# Patient Record
Sex: Female | Born: 1955
Health system: Southern US, Community
[De-identification: ages and names within clinical notes are randomized; demographics above are authoritative.]

## PROBLEM LIST (undated history)

## (undated) DIAGNOSIS — Z9889 Other specified postprocedural states: Secondary | ICD-10-CM

## (undated) DIAGNOSIS — R112 Nausea with vomiting, unspecified: Secondary | ICD-10-CM

## (undated) HISTORY — PX: VAGINAL HYSTERECTOMY: SUR661

---

## 1998-06-27 ENCOUNTER — Other Ambulatory Visit: Admission: RE | Admit: 1998-06-27 | Discharge: 1998-06-27 | Payer: Self-pay | Admitting: Obstetrics and Gynecology

## 1999-05-13 ENCOUNTER — Emergency Department (HOSPITAL_COMMUNITY): Admission: EM | Admit: 1999-05-13 | Discharge: 1999-05-13 | Payer: Self-pay | Admitting: Emergency Medicine

## 1999-09-27 ENCOUNTER — Other Ambulatory Visit: Admission: RE | Admit: 1999-09-27 | Discharge: 1999-09-27 | Payer: Self-pay | Admitting: Obstetrics and Gynecology

## 2000-03-30 ENCOUNTER — Emergency Department (HOSPITAL_COMMUNITY): Admission: EM | Admit: 2000-03-30 | Discharge: 2000-03-30 | Payer: Self-pay | Admitting: Emergency Medicine

## 2000-03-30 ENCOUNTER — Encounter: Payer: Self-pay | Admitting: Emergency Medicine

## 2001-04-28 ENCOUNTER — Other Ambulatory Visit: Admission: RE | Admit: 2001-04-28 | Discharge: 2001-04-28 | Payer: Self-pay | Admitting: Obstetrics and Gynecology

## 2002-05-03 ENCOUNTER — Other Ambulatory Visit: Admission: RE | Admit: 2002-05-03 | Discharge: 2002-05-03 | Payer: Self-pay | Admitting: Obstetrics and Gynecology

## 2003-03-18 ENCOUNTER — Ambulatory Visit (HOSPITAL_COMMUNITY): Admission: RE | Admit: 2003-03-18 | Discharge: 2003-03-18 | Payer: Self-pay | Admitting: Family Medicine

## 2003-03-18 ENCOUNTER — Encounter: Payer: Self-pay | Admitting: Family Medicine

## 2003-08-09 ENCOUNTER — Other Ambulatory Visit: Admission: RE | Admit: 2003-08-09 | Discharge: 2003-08-09 | Payer: Self-pay | Admitting: Obstetrics and Gynecology

## 2006-04-15 ENCOUNTER — Other Ambulatory Visit: Admission: RE | Admit: 2006-04-15 | Discharge: 2006-04-15 | Payer: Self-pay | Admitting: Obstetrics and Gynecology

## 2006-10-28 HISTORY — PX: CARPAL TUNNEL RELEASE: SHX101

## 2007-04-17 ENCOUNTER — Encounter: Admission: RE | Admit: 2007-04-17 | Discharge: 2007-04-17 | Payer: Self-pay | Admitting: Orthopedic Surgery

## 2008-04-13 ENCOUNTER — Other Ambulatory Visit: Admission: RE | Admit: 2008-04-13 | Discharge: 2008-04-13 | Payer: Self-pay | Admitting: Obstetrics and Gynecology

## 2008-04-27 LAB — CONVERTED CEMR LAB: Pap Smear: NORMAL

## 2008-06-15 ENCOUNTER — Encounter: Payer: Self-pay | Admitting: Internal Medicine

## 2008-07-14 ENCOUNTER — Ambulatory Visit: Payer: Self-pay | Admitting: Internal Medicine

## 2008-07-14 DIAGNOSIS — M899 Disorder of bone, unspecified: Secondary | ICD-10-CM | POA: Insufficient documentation

## 2008-07-14 DIAGNOSIS — M949 Disorder of cartilage, unspecified: Secondary | ICD-10-CM

## 2008-07-14 LAB — CONVERTED CEMR LAB
Bilirubin Urine: NEGATIVE
Blood in Urine, dipstick: NEGATIVE
Glucose, Urine, Semiquant: NEGATIVE
Ketones, urine, test strip: NEGATIVE
Nitrite: NEGATIVE
Protein, U semiquant: NEGATIVE
Specific Gravity, Urine: 1.02
Urobilinogen, UA: 0.2
pH: 6

## 2008-07-15 ENCOUNTER — Encounter: Payer: Self-pay | Admitting: Internal Medicine

## 2008-07-15 LAB — CONVERTED CEMR LAB
AST: 29 units/L (ref 0–37)
Alkaline Phosphatase: 66 units/L (ref 39–117)
Basophils Relative: 0.1 % (ref 0.0–3.0)
Creatinine, Ser: 0.7 mg/dL (ref 0.4–1.2)
Direct LDL: 116.5 mg/dL
Eosinophils Absolute: 0.1 10*3/uL (ref 0.0–0.7)
Eosinophils Relative: 0.8 % (ref 0.0–5.0)
GFR calc non Af Amer: 93 mL/min
Glucose, Bld: 96 mg/dL (ref 70–99)
Lymphocytes Relative: 19.9 % (ref 12.0–46.0)
MCV: 89.3 fL (ref 78.0–100.0)
Neutrophils Relative %: 73 % (ref 43.0–77.0)
Platelets: 226 10*3/uL (ref 150–400)
Potassium: 3.8 meq/L (ref 3.5–5.1)
RBC: 4.53 M/uL (ref 3.87–5.11)
RDW: 12.4 % (ref 11.5–14.6)
TSH: 0.56 microintl units/mL (ref 0.35–5.50)
Total Bilirubin: 0.7 mg/dL (ref 0.3–1.2)
Triglycerides: 61 mg/dL (ref 0–149)

## 2009-03-08 ENCOUNTER — Encounter: Payer: Self-pay | Admitting: Internal Medicine

## 2009-04-07 ENCOUNTER — Ambulatory Visit: Payer: Self-pay | Admitting: Internal Medicine

## 2009-04-07 DIAGNOSIS — J029 Acute pharyngitis, unspecified: Secondary | ICD-10-CM | POA: Insufficient documentation

## 2009-04-10 ENCOUNTER — Telehealth: Payer: Self-pay | Admitting: Internal Medicine

## 2009-05-11 ENCOUNTER — Other Ambulatory Visit: Admission: RE | Admit: 2009-05-11 | Discharge: 2009-05-11 | Payer: Self-pay | Admitting: Obstetrics and Gynecology

## 2009-05-11 ENCOUNTER — Encounter: Payer: Self-pay | Admitting: Obstetrics and Gynecology

## 2009-05-11 ENCOUNTER — Ambulatory Visit: Payer: Self-pay | Admitting: Obstetrics and Gynecology

## 2009-09-06 ENCOUNTER — Ambulatory Visit: Payer: Self-pay | Admitting: Internal Medicine

## 2009-09-24 ENCOUNTER — Emergency Department (HOSPITAL_COMMUNITY): Admission: EM | Admit: 2009-09-24 | Discharge: 2009-09-24 | Payer: Self-pay | Admitting: Emergency Medicine

## 2009-09-27 ENCOUNTER — Telehealth: Payer: Self-pay | Admitting: Internal Medicine

## 2010-08-17 ENCOUNTER — Ambulatory Visit: Payer: Self-pay | Admitting: Internal Medicine

## 2010-10-28 LAB — HM COLONOSCOPY

## 2010-11-27 NOTE — Assessment & Plan Note (Signed)
Summary: FLU-SHOT/RCD  Nurse Visit   Allergies: No Known Drug Allergies  Orders Added: 1)  Admin 1st Vaccine [90471] 2)  Flu Vaccine 17yrs + [16109] Flu Vaccine Consent Questions     Do you have a history of severe allergic reactions to this vaccine? no    Any prior history of allergic reactions to egg and/or gelatin? no    Do you have a sensitivity to the preservative Thimersol? no    Do you have a past history of Guillan-Barre Syndrome? no    Do you currently have an acute febrile illness? no    Have you ever had a severe reaction to latex? no    Vaccine information given and explained to patient? yes    Are you currently pregnant? no    Lot Number:[id number]   Exp Date:04/27/2011   Site Given  Left Deltoid IM  Appended Document: FLU-SHOT/RCD    Clinical Lists Changes  Orders: Added new Service order of Tdap => 10yrs IM (60454) - Signed Added new Service order of Admin 1st Vaccine (09811) - Signed Observations: Added new observation of TD BOOSTERLO: [id number] (08/17/2010 10:58) Added new observation of TD BOOST EXP: 08/16/2012 (08/17/2010 10:58) Added new observation of TD BOOSTERBY: Debby Meadows CMA AAMA (08/17/2010 10:58) Added new observation of TD BOOSTERRT: IM (08/17/2010 10:58) Added new observation of TDBOOSTERDSE: 0.5 ml (08/17/2010 10:58) Added new observation of TD BOOSTERMF: GlaxoSmithKline (08/17/2010 10:58) Added new observation of TD BOOST SIT: right deltoid (08/17/2010 10:58) Added new observation of TD BOOSTER: Tdap (08/17/2010 10:58)       Immunizations Administered:  Tetanus Vaccine:    Vaccine Type: Tdap    Site: right deltoid    Mfr: GlaxoSmithKline    Dose: 0.5 ml    Route: IM    Given by: Lynann Beaver CMA AAMA    Exp. Date: 08/16/2012    Lot #: [id number]

## 2011-09-12 ENCOUNTER — Ambulatory Visit: Payer: Medicare HMO | Admitting: *Deleted

## 2011-09-18 ENCOUNTER — Ambulatory Visit
Admission: RE | Admit: 2011-09-18 | Discharge: 2011-09-18 | Disposition: A | Discharge status: Home / Self Care | Payer: Private Health Insurance - Indemnity | Source: Ambulatory Visit | Attending: Specialist | Admitting: Specialist

## 2011-09-18 ENCOUNTER — Other Ambulatory Visit: Payer: Self-pay | Admitting: Specialist

## 2011-09-18 DIAGNOSIS — M25569 Pain in unspecified knee: Secondary | ICD-10-CM

## 2011-09-20 ENCOUNTER — Other Ambulatory Visit: Payer: Self-pay | Admitting: Orthopaedic Surgery

## 2011-09-20 NOTE — H&P (Signed)
Rebecca Davis is an 55 y.o. female.   Chief Complaint:  Left knee tibial plateau fracture HPI:  DOI 09/18/11  Her 80 lbs Lab puppy ran into her from the side with valgus injury to her left knee with acute pain and inability to walk.  Xray and CT showed left tibial plateau fracture with more than 8mm depression  No past medical history on file. carpal tunnel release 2009  No past surgical history on file.  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   No current facility-administered medications on file as of 09/20/2011.   Calcium, vit D. ASA 1 po bid     norco currently for left knee fracture pain  No results found for this or any previous visit (from the past 48 hour(s)). No results found. labs to be drawn Monday  Review of Systems  Constitutional: Negative.   HENT: Negative.   Eyes: Negative.   Respiratory: Negative.   Cardiovascular: Negative.   Gastrointestinal: Negative.   Genitourinary: Negative.   Musculoskeletal: Positive for joint pain.       Previous Carpal tunnel release 2009  Skin: Negative.   Neurological: Negative.   Endo/Heme/Allergies: Negative.   Psychiatric/Behavioral: Negative.     There were no vitals taken for this visit. Physical Exam  Constitutional: She is oriented to person, place, and time. She appears well-developed and well-nourished.  HENT:  Head: Normocephalic and atraumatic.  Eyes: Pupils are equal, round, and reactive to light.  Neck: Normal range of motion.  Cardiovascular: Normal rate and regular rhythm.   No murmur heard. Respiratory: Effort normal and breath sounds normal. No respiratory distress. She has no wheezes.  GI: Soft.  Musculoskeletal:       Left knee hemarthrosis,  Pulses 2 plus  DP and  PT no pain with hip ROM  Neurological: She is alert and oriented to person, place, and time.  Skin: Skin is warm and dry. No rash noted.  Psychiatric: She has a normal mood and affect.     Assessment/Plan Left depressed lateral tibial plateau fracture. Discussed plan and procedure , risks vs benefit of surgery, restoration of joint surface,  Risks of infection, bleeding , reoperation,  Stiffness, hardware problems,  Knee arthritis,  DVT etc among risks discussed.  She understands and requests we proceed.  All ?'s answered.   [name] C 09/20/2011, 7:31 PM

## 2011-09-22 MED ORDER — DEXTROSE 5 % IV SOLN: 1.0000 g | INTRAVENOUS | Status: DC

## 2011-09-22 MED ORDER — CEFAZOLIN SODIUM 1 G IJ SOLR: 1.0000 g | INTRAMUSCULAR | Status: DC

## 2011-09-23 ENCOUNTER — Encounter (HOSPITAL_COMMUNITY)
Admission: RE | Disposition: A | Discharge status: Home Health | Payer: Self-pay | Source: Ambulatory Visit | Attending: Orthopaedic Surgery

## 2011-09-23 ENCOUNTER — Encounter (HOSPITAL_COMMUNITY): Payer: Self-pay | Admitting: *Deleted

## 2011-09-23 ENCOUNTER — Inpatient Hospital Stay (HOSPITAL_COMMUNITY): Payer: Private Health Insurance - Indemnity | Admitting: Anesthesiology

## 2011-09-23 ENCOUNTER — Inpatient Hospital Stay (HOSPITAL_COMMUNITY)
Admission: RE | Admit: 2011-09-23 | Discharge: 2011-09-26 | DRG: 494 | Disposition: A | Discharge status: Home Health | Payer: Private Health Insurance - Indemnity | Source: Ambulatory Visit | Attending: Orthopaedic Surgery | Admitting: Orthopaedic Surgery

## 2011-09-23 ENCOUNTER — Inpatient Hospital Stay (HOSPITAL_COMMUNITY): Payer: Private Health Insurance - Indemnity

## 2011-09-23 ENCOUNTER — Encounter (HOSPITAL_COMMUNITY): Payer: Self-pay | Admitting: Anesthesiology

## 2011-09-23 DIAGNOSIS — IMO0002 Reserved for concepts with insufficient information to code with codable children: Secondary | ICD-10-CM | POA: Diagnosis present

## 2011-09-23 DIAGNOSIS — S82143A Displaced bicondylar fracture of unspecified tibia, initial encounter for closed fracture: Secondary | ICD-10-CM

## 2011-09-23 DIAGNOSIS — J029 Acute pharyngitis, unspecified: Secondary | ICD-10-CM | POA: Diagnosis present

## 2011-09-23 DIAGNOSIS — Z79899 Other long term (current) drug therapy: Secondary | ICD-10-CM

## 2011-09-23 DIAGNOSIS — S82109A Unspecified fracture of upper end of unspecified tibia, initial encounter for closed fracture: Principal | ICD-10-CM | POA: Diagnosis present

## 2011-09-23 DIAGNOSIS — Z7982 Long term (current) use of aspirin: Secondary | ICD-10-CM

## 2011-09-23 DIAGNOSIS — M899 Disorder of bone, unspecified: Secondary | ICD-10-CM | POA: Diagnosis present

## 2011-09-23 DIAGNOSIS — M949 Disorder of cartilage, unspecified: Secondary | ICD-10-CM

## 2011-09-23 HISTORY — DX: Other specified postprocedural states: R11.2

## 2011-09-23 HISTORY — PX: ORIF TIBIA PLATEAU: SHX2132

## 2011-09-23 HISTORY — DX: Other specified postprocedural states: Z98.890

## 2011-09-23 LAB — CBC
MCH: 29.6 pg (ref 26.0–34.0)
Platelets: 213 10*3/uL (ref 150–400)
RBC: 4.33 MIL/uL (ref 3.87–5.11)
RDW: 13 % (ref 11.5–15.5)
WBC: 7.8 10*3/uL (ref 4.0–10.5)

## 2011-09-23 LAB — COMPREHENSIVE METABOLIC PANEL
ALT: 11 U/L (ref 0–35)
AST: 20 U/L (ref 0–37)
Albumin: 3.6 g/dL (ref 3.5–5.2)
CO2: 31 mEq/L (ref 19–32)
Calcium: 9.5 mg/dL (ref 8.4–10.5)
Chloride: 105 mEq/L (ref 96–112)
GFR calc non Af Amer: >90 mL/min (ref >90)
Sodium: 142 mEq/L (ref 135–145)

## 2011-09-23 LAB — URINALYSIS, ROUTINE W REFLEX MICROSCOPIC
Glucose, UA: NEGATIVE mg/dL
Ketones, ur: NEGATIVE mg/dL
Leukocytes, UA: NEGATIVE
Nitrite: NEGATIVE
Specific Gravity, Urine: 1.012 (ref 1.005–1.030)
pH: 6.5 (ref 5.0–8.0)

## 2011-09-23 LAB — SURGICAL PCR SCREEN
MRSA, PCR: NEGATIVE
Staphylococcus aureus: NEGATIVE

## 2011-09-23 SURGERY — OPEN REDUCTION INTERNAL FIXATION (ORIF) TIBIAL PLATEAU
Anesthesia: General | Laterality: Left | Wound class: Clean

## 2011-09-23 SURGERY — Surgical Case
Anesthesia: *Unknown

## 2011-09-23 MED ORDER — GLYCOPYRROLATE 0.2 MG/ML IJ SOLN
INTRAMUSCULAR | Status: DC | PRN
  Administered 2011-09-23: .4 mg via INTRAVENOUS

## 2011-09-23 MED ORDER — ONDANSETRON HCL 4 MG/2ML IJ SOLN
INTRAMUSCULAR | Status: DC | PRN
  Administered 2011-09-23 (×2): 4 mg via INTRAVENOUS

## 2011-09-23 MED ORDER — METHOCARBAMOL 500 MG PO TABS
500.0000 mg | ORAL_TABLET | Freq: Four times a day (QID) | ORAL | Status: DC | PRN
  Administered 2011-09-23 – 2011-09-24 (×4): 500 mg via ORAL
  Filled 2011-09-23 (×4): qty 1

## 2011-09-23 MED ORDER — BUPIVACAINE HCL (PF) 0.25 % IJ SOLN
INTRAMUSCULAR | Status: DC | PRN
  Administered 2011-09-23: 7.5 mL

## 2011-09-23 MED ORDER — METHOCARBAMOL 100 MG/ML IJ SOLN
500.0000 mg | Freq: Four times a day (QID) | INTRAVENOUS | Status: DC | PRN
  Filled 2011-09-23: qty 5

## 2011-09-23 MED ORDER — HYDROMORPHONE HCL PF 1 MG/ML IJ SOLN
0.2500 mg | INTRAMUSCULAR | Status: DC | PRN
  Administered 2011-09-23: 0.5 mg via INTRAVENOUS
  Administered 2011-09-23 (×2): 0.25 mg via INTRAVENOUS

## 2011-09-23 MED ORDER — SUFENTANIL CITRATE 50 MCG/ML IV SOLN
INTRAVENOUS | Status: DC | PRN
  Administered 2011-09-23 (×2): 10 ug via INTRAVENOUS

## 2011-09-23 MED ORDER — DIPHENHYDRAMINE HCL 12.5 MG/5ML PO ELIX
12.5000 mg | ORAL_SOLUTION | ORAL | Status: DC | PRN
Start: 2011-09-23 — End: 2011-09-26
  Filled 2011-09-23: qty 10

## 2011-09-23 MED ORDER — CEFAZOLIN SODIUM 1-5 GM-% IV SOLN
1.0000 g | Freq: Four times a day (QID) | INTRAVENOUS | Status: AC
  Administered 2011-09-23 – 2011-09-24 (×3): 1 g via INTRAVENOUS
  Filled 2011-09-23 (×3): qty 50

## 2011-09-23 MED ORDER — METOCLOPRAMIDE HCL 5 MG/ML IJ SOLN
5.0000 mg | Freq: Three times a day (TID) | INTRAMUSCULAR | Status: DC | PRN
  Filled 2011-09-23: qty 2

## 2011-09-23 MED ORDER — SENNOSIDES-DOCUSATE SODIUM 8.6-50 MG PO TABS: 1.0000 | ORAL_TABLET | Freq: Every evening | ORAL | Status: DC | PRN

## 2011-09-23 MED ORDER — ONDANSETRON HCL 4 MG/2ML IJ SOLN: 4.0000 mg | Freq: Once | INTRAMUSCULAR | Status: DC | PRN

## 2011-09-23 MED ORDER — CEFAZOLIN SODIUM 1-5 GM-% IV SOLN
1.0000 g | INTRAVENOUS | Status: AC
  Administered 2011-09-23: 1 g via INTRAVENOUS
  Filled 2011-09-23 (×2): qty 50

## 2011-09-23 MED ORDER — HYDROCODONE-ACETAMINOPHEN 5-325 MG PO TABS
1.0000 | ORAL_TABLET | ORAL | Status: DC | PRN
  Administered 2011-09-23 – 2011-09-24 (×2): 2 via ORAL
  Filled 2011-09-23 (×2): qty 2

## 2011-09-23 MED ORDER — KCL IN DEXTROSE-NACL 20-5-0.45 MEQ/L-%-% IV SOLN
INTRAVENOUS | Status: DC
  Administered 2011-09-23: 22:00:00 via INTRAVENOUS
  Filled 2011-09-23 (×6): qty 1000

## 2011-09-23 MED ORDER — PROPOFOL 10 MG/ML IV EMUL
INTRAVENOUS | Status: DC | PRN
  Administered 2011-09-23: 150 mg via INTRAVENOUS

## 2011-09-23 MED ORDER — ONDANSETRON HCL 4 MG/2ML IJ SOLN
4.0000 mg | Freq: Four times a day (QID) | INTRAMUSCULAR | Status: DC | PRN
  Administered 2011-09-24 – 2011-09-26 (×5): 4 mg via INTRAVENOUS
  Filled 2011-09-23 (×5): qty 2

## 2011-09-23 MED ORDER — ZOLPIDEM TARTRATE 5 MG PO TABS: 5.0000 mg | ORAL_TABLET | Freq: Every evening | ORAL | Status: DC | PRN

## 2011-09-23 MED ORDER — ASPIRIN EC 325 MG PO TBEC
325.0000 mg | DELAYED_RELEASE_TABLET | Freq: Two times a day (BID) | ORAL | Status: DC
  Administered 2011-09-23 – 2011-09-26 (×6): 325 mg via ORAL
  Filled 2011-09-23 (×7): qty 1

## 2011-09-23 MED ORDER — ROCURONIUM BROMIDE 100 MG/10ML IV SOLN
INTRAVENOUS | Status: DC | PRN
  Administered 2011-09-23: 40 mg via INTRAVENOUS

## 2011-09-23 MED ORDER — LACTATED RINGERS IV SOLN
INTRAVENOUS | Status: DC | PRN
  Administered 2011-09-23: 16:00:00 via INTRAVENOUS

## 2011-09-23 MED ORDER — OXYCODONE-ACETAMINOPHEN 5-325 MG PO TABS
1.0000 | ORAL_TABLET | ORAL | Status: DC | PRN
  Administered 2011-09-24 – 2011-09-26 (×7): 2 via ORAL
  Filled 2011-09-23 (×7): qty 2

## 2011-09-23 MED ORDER — CHLORHEXIDINE GLUCONATE 4 % EX LIQD: 60.0000 mL | Freq: Once | CUTANEOUS | Status: DC

## 2011-09-23 MED ORDER — NEOSTIGMINE METHYLSULFATE 1 MG/ML IJ SOLN
INTRAMUSCULAR | Status: DC | PRN
  Administered 2011-09-23: 3 mg via INTRAVENOUS

## 2011-09-23 MED ORDER — DOCUSATE SODIUM 100 MG PO CAPS
100.0000 mg | ORAL_CAPSULE | Freq: Two times a day (BID) | ORAL | Status: DC
  Administered 2011-09-23 – 2011-09-26 (×6): 100 mg via ORAL
  Filled 2011-09-23 (×8): qty 1

## 2011-09-23 MED ORDER — ENOXAPARIN SODIUM 30 MG/0.3ML ~~LOC~~ SOLN
30.0000 mg | Freq: Two times a day (BID) | SUBCUTANEOUS | Status: DC
  Administered 2011-09-24 – 2011-09-26 (×5): 30 mg via SUBCUTANEOUS
  Filled 2011-09-23 (×8): qty 0.3

## 2011-09-23 MED ORDER — SODIUM CHLORIDE 0.9 % IR SOLN
Status: DC | PRN
  Administered 2011-09-23: 1

## 2011-09-23 MED ORDER — LACTATED RINGERS IV SOLN
INTRAVENOUS | Status: DC
  Administered 2011-09-23: 15:00:00 via INTRAVENOUS

## 2011-09-23 MED ORDER — ONDANSETRON HCL 4 MG PO TABS: 4.0000 mg | ORAL_TABLET | Freq: Four times a day (QID) | ORAL | Status: DC | PRN

## 2011-09-23 MED ORDER — MUPIROCIN 2 % EX OINT
TOPICAL_OINTMENT | CUTANEOUS | Status: AC
  Administered 2011-09-23: 1 via NASAL
  Filled 2011-09-23: qty 22

## 2011-09-23 MED ORDER — HYDROMORPHONE HCL PF 1 MG/ML IJ SOLN
0.5000 mg | INTRAMUSCULAR | Status: DC | PRN
  Administered 2011-09-23: 0.5 mg via INTRAVENOUS
  Administered 2011-09-23 – 2011-09-24 (×4): 1 mg via INTRAVENOUS
  Filled 2011-09-23 (×5): qty 1

## 2011-09-23 MED ORDER — METOCLOPRAMIDE HCL 10 MG PO TABS: 5.0000 mg | ORAL_TABLET | Freq: Three times a day (TID) | ORAL | Status: DC | PRN

## 2011-09-23 MED ORDER — DROPERIDOL 2.5 MG/ML IJ SOLN
INTRAMUSCULAR | Status: DC | PRN
  Administered 2011-09-23: 0.625 mg via INTRAVENOUS

## 2011-09-23 SURGICAL SUPPLY — 89 items
APL SKNCLS STERI-STRIP NONHPOA (GAUZE/BANDAGES/DRESSINGS) ×1
BANDAGE ELASTIC 4 VELCRO ST LF (GAUZE/BANDAGES/DRESSINGS) IMPLANT
BANDAGE ELASTIC 6 VELCRO ST LF (GAUZE/BANDAGES/DRESSINGS) ×1 IMPLANT
BANDAGE GAUZE ELAST BULKY 4 IN (GAUZE/BANDAGES/DRESSINGS) IMPLANT
BENZOIN TINCTURE PRP APPL 2/3 (GAUZE/BANDAGES/DRESSINGS) ×1 IMPLANT
BIT DRILL 100X2.5XANTM LCK (BIT) IMPLANT
BIT DRILL CAL (BIT) IMPLANT
BIT DRL 100X2.5XANTM LCK (BIT) ×1
BLADE SURG 10 STRL SS (BLADE) ×1 IMPLANT
BLADE SURG ROTATE 9660 (MISCELLANEOUS) IMPLANT
BONE CHIP PRESERV 20CC (Bone Implant) ×1 IMPLANT
CLEANER TIP ELECTROSURG 2X2 (MISCELLANEOUS) ×1 IMPLANT
CLOSURE STERI STRIP 1/2 X4 (GAUZE/BANDAGES/DRESSINGS) ×1 IMPLANT
CLOTH BEACON ORANGE TIMEOUT ST (SAFETY) ×2 IMPLANT
COVER MAYO STAND STRL (DRAPES) ×2 IMPLANT
COVER SURGICAL LIGHT HANDLE (MISCELLANEOUS) ×2 IMPLANT
CUFF TOURNIQUET SINGLE 34IN LL (TOURNIQUET CUFF) ×1 IMPLANT
CUFF TOURNIQUET SINGLE 44IN (TOURNIQUET CUFF) IMPLANT
DRAIN SNY 10 ROU (WOUND CARE) ×1 IMPLANT
DRAIN SNY WOU MED RD (WOUND CARE) ×1 IMPLANT
DRAPE C-ARM 42X72 X-RAY (DRAPES) ×1 IMPLANT
DRAPE INCISE IOBAN 66X45 STRL (DRAPES) ×2 IMPLANT
DRAPE PROXIMA HALF (DRAPES) ×1 IMPLANT
DRAPE U-SHAPE 47X51 STRL (DRAPES) ×2 IMPLANT
DRILL BIT 2.5MM (BIT) ×2
DRILL BIT CAL (BIT) ×2
DRSG ADAPTIC 3X8 NADH LF (GAUZE/BANDAGES/DRESSINGS) IMPLANT
DRSG PAD ABDOMINAL 8X10 ST (GAUZE/BANDAGES/DRESSINGS) ×1 IMPLANT
DURAPREP 26ML APPLICATOR (WOUND CARE) ×2 IMPLANT
ELECT REM PT RETURN 9FT ADLT (ELECTROSURGICAL) ×2
ELECTRODE REM PT RTRN 9FT ADLT (ELECTROSURGICAL) ×1 IMPLANT
EVACUATOR 1/8 PVC DRAIN (DRAIN) IMPLANT
GAUZE SPONGE 4X4 12PLY STRL LF (GAUZE/BANDAGES/DRESSINGS) ×1 IMPLANT
GAUZE XEROFORM 1X8 LF (GAUZE/BANDAGES/DRESSINGS) ×1 IMPLANT
GLOVE BIOGEL PI IND STRL 7.5 (GLOVE) ×1 IMPLANT
GLOVE BIOGEL PI IND STRL 8 (GLOVE) ×1 IMPLANT
GLOVE BIOGEL PI INDICATOR 7.5 (GLOVE) ×1
GLOVE BIOGEL PI INDICATOR 8 (GLOVE) ×1
GLOVE ECLIPSE 7.0 STRL STRAW (GLOVE) ×2 IMPLANT
GLOVE ORTHO TXT STRL SZ7.5 (GLOVE) ×2 IMPLANT
GOWN PREVENTION PLUS LG XLONG (DISPOSABLE) IMPLANT
GOWN PREVENTION PLUS XLARGE (GOWN DISPOSABLE) ×2 IMPLANT
GOWN STRL NON-REIN LRG LVL3 (GOWN DISPOSABLE) ×4 IMPLANT
IMMOBILIZER KNEE 22 UNIV (SOFTGOODS) ×1 IMPLANT
K-WIRE ACE 1.6X6 (WIRE) ×4
KIT BASIN OR (CUSTOM PROCEDURE TRAY) ×2 IMPLANT
KIT ROOM TURNOVER OR (KITS) ×2 IMPLANT
KWIRE ACE 1.6X6 (WIRE) IMPLANT
MANIFOLD NEPTUNE II (INSTRUMENTS) ×2 IMPLANT
NDL HYPO 25X1 1.5 SAFETY (NEEDLE) ×1 IMPLANT
NEEDLE HYPO 25X1 1.5 SAFETY (NEEDLE) ×2 IMPLANT
NS IRRIG 1000ML POUR BTL (IV SOLUTION) ×2 IMPLANT
PACK ORTHO EXTREMITY (CUSTOM PROCEDURE TRAY) ×2 IMPLANT
PAD ARMBOARD 7.5X6 YLW CONV (MISCELLANEOUS) ×2 IMPLANT
PAD CAST 4YDX4 CTTN HI CHSV (CAST SUPPLIES) IMPLANT
PADDING CAST COTTON 4X4 STRL (CAST SUPPLIES)
PADDING CAST COTTON 6X4 STRL (CAST SUPPLIES) IMPLANT
PADDING WEBRIL 6 STERILE (GAUZE/BANDAGES/DRESSINGS) ×1 IMPLANT
PLATE LOCK 3H STD LT PROX TIB (Plate) ×1 IMPLANT
SCREW CORTICAL 3.5MM  34MM (Screw) ×1 IMPLANT
SCREW CORTICAL 3.5MM 34MM (Screw) IMPLANT
SCREW CORTICAL 3.5MM 36MM (Screw) ×1 IMPLANT
SCREW LOCK CORT STAR 3.5X26 (Screw) ×1 IMPLANT
SCREW LOCK CORT STAR 3.5X32 (Screw) ×1 IMPLANT
SCREW LOCK CORT STAR 3.5X34 (Screw) ×1 IMPLANT
SCREW LOCK CORT STAR 3.5X38 (Screw) ×1 IMPLANT
SCREW LOCK CORT STAR 3.5X40 (Screw) ×1 IMPLANT
SCREW LOCK CORT STAR 3.5X44 (Screw) ×1 IMPLANT
SCREW LOCK CORT STAR 3.5X46 (Screw) ×1 IMPLANT
SCREW LOCK CORT STAR 3.5X60 (Screw) ×1 IMPLANT
SCREW LP 3.5X75MM (Screw) ×1 IMPLANT
SPONGE GAUZE 4X4 12PLY (GAUZE/BANDAGES/DRESSINGS) IMPLANT
SPONGE LAP 18X18 X RAY DECT (DISPOSABLE) ×2 IMPLANT
STAPLER VISISTAT 35W (STAPLE) ×1 IMPLANT
STOCKINETTE IMPERVIOUS LG (DRAPES) ×2 IMPLANT
SUCTION FRAZIER TIP 10 FR DISP (SUCTIONS) ×2 IMPLANT
SUT ETHIBOND 2 0 V5 (SUTURE) ×2 IMPLANT
SUT VIC AB 0 CT1 27 (SUTURE) ×2
SUT VIC AB 0 CT1 27XBRD ANBCTR (SUTURE) ×1 IMPLANT
SUT VIC AB 1 CT1 27 (SUTURE) ×2
SUT VIC AB 1 CT1 27XBRD ANBCTR (SUTURE) ×1 IMPLANT
SUT VIC AB 2-0 CT1 27 (SUTURE)
SUT VIC AB 2-0 CT1 TAPERPNT 27 (SUTURE) IMPLANT
SYR CONTROL 10ML LL (SYRINGE) ×2 IMPLANT
TOWEL OR 17X24 6PK STRL BLUE (TOWEL DISPOSABLE) ×2 IMPLANT
TOWEL OR 17X26 10 PK STRL BLUE (TOWEL DISPOSABLE) ×2 IMPLANT
TUBE CONNECTING 12X1/4 (SUCTIONS) ×2 IMPLANT
WATER STERILE IRR 1000ML POUR (IV SOLUTION) ×2 IMPLANT
YANKAUER SUCT BULB TIP NO VENT (SUCTIONS) ×2 IMPLANT

## 2011-09-23 NOTE — Transfer of Care (Signed)
Immediate Anesthesia Transfer of Care Note  Patient: Rebecca Davis  Procedure(s) Performed:  OPEN REDUCTION INTERNAL FIXATION (ORIF) TIBIAL PLATEAU  Patient Location: PACU  Anesthesia Type: General  Level of Consciousness: awake, alert , oriented and patient cooperative  Airway & Oxygen Therapy: Patient Spontanous Breathing and Patient connected to nasal cannula oxygen  Post-op Assessment: Report given to PACU RN, Post -op Vital signs reviewed and unstable, Anesthesiologist notified and Patient moving all extremities X 4  Post vital signs: Reviewed and stable  Complications: No apparent anesthesia complications

## 2011-09-23 NOTE — Anesthesia Preprocedure Evaluation (Addendum)
Anesthesia Evaluation  Patient identified by MRN, date of birth, ID band Patient awake    Reviewed: Allergy & Precautions, H&P , NPO status   History of Anesthesia Complications (+) PONV and Family history of anesthesia reaction  Airway Mallampati: I TM Distance: >3 FB     Dental  (+) Teeth Intact and Dental Advisory Given   Pulmonary neg pulmonary ROS,          Cardiovascular neg cardio ROS regular Normal    Neuro/Psych Negative Neurological ROS     GI/Hepatic negative GI ROS, Neg liver ROS,   Endo/Other  Negative Endocrine ROS  Renal/GU negative Renal ROS     Musculoskeletal negative musculoskeletal ROS (+)   Abdominal   Peds  Hematology negative hematology ROS (+)   Anesthesia Other Findings   Reproductive/Obstetrics negative OB ROS                        Anesthesia Physical Anesthesia Plan  ASA: I  Anesthesia Plan: General   Post-op Pain Management:    Induction: Intravenous  Airway Management Planned: Oral ETT  Additional Equipment:   Intra-op Plan:   Post-operative Plan: Extubation in OR  Informed Consent: I have reviewed the patients History and Physical, chart, labs and discussed the procedure including the risks, benefits and alternatives for the proposed anesthesia with the patient or authorized representative who has indicated his/her understanding and acceptance.   Dental advisory given  Plan Discussed with: CRNA, Anesthesiologist and Surgeon  Anesthesia Plan Comments:        Anesthesia Quick Evaluation

## 2011-09-23 NOTE — Op Note (Signed)
09/23/2011  5:42 PM  PATIENT:  Rebecca Davis  55 y.o. female [id number]  PRE-OPERATIVE DIAGNOSIS:  left tibial plateau fracture  POST-OPERATIVE DIAGNOSIS:  left tibial plateau fracture  PROCEDURE:  Procedure(s): OPEN REDUCTION INTERNAL FIXATION (ORIF) TIBIAL PLATEAU , Left.  Biomet plate  Cancellous chips allograft  SURGEON:  Surgeon(s): Eldred Manges  PHYSICIAN ASSISTANT:Sheila VernonPA-C   Medically necessary and present for the procedure.   ANESTHESIA:  Got plus 10 cc Marcaine  EBL:  Total I/O In: 1300 [I.V.:1300] Out: -   BLOOD ADMINISTERED:none  DRAINS: Hemovac drain   LOCAL MEDICATIONS USED:  MARCAINE 10CC  SPECIMEN:  No Specimen  TOURNIQUET:   Total Tourniquet Time Documented: Thigh (Left) - 46 minutes  DICTATION: .Other Dictation: Dictation Number [id number]  YATES,MARK C

## 2011-09-23 NOTE — H&P (Signed)
Rebecca Davis is an 55 y.o. female.   Chief Complaint:  Left knee tibial plateau fracture HPI:  DOI 09/18/11  Her 80 lbs Lab puppy ran into her from the side with valgus injury to her left knee with acute pain and inability to walk.  Xray and CT showed left tibial plateau fracture with more than 8mm depression  Past Medical History  Diagnosis Date  . PONV (postoperative nausea and vomiting)    carpal tunnel release 2009  Past Surgical History  Procedure Date  . Vaginal hysterectomy     partial  . 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   No current facility-administered medications on file as of 09/20/2011.   Calcium, vit D. ASA 1 po bid     norco currently for left knee fracture pain  Results for orders placed during the hospital encounter of 09/23/11 (from the past 48 hour(s))  URINALYSIS, ROUTINE W REFLEX MICROSCOPIC     Status: Normal   Collection Time   09/23/11 10:37 AM      Component Value Range Comment   Color, Urine YELLOW  YELLOW     Appearance CLEAR  CLEAR     Specific Gravity, Urine 1.012  1.005 - 1.030     pH 6.5  5.0 - 8.0     Glucose, UA NEGATIVE  NEGATIVE (mg/dL)    Hgb urine dipstick NEGATIVE  NEGATIVE     Bilirubin Urine NEGATIVE  NEGATIVE     Ketones, ur NEGATIVE  NEGATIVE (mg/dL)    Protein, ur NEGATIVE  NEGATIVE (mg/dL)    Urobilinogen, UA 1.0  0.0 - 1.0 (mg/dL)    Nitrite NEGATIVE  NEGATIVE     Leukocytes, UA NEGATIVE  NEGATIVE  MICROSCOPIC NOT DONE ON URINES WITH NEGATIVE PROTEIN, BLOOD, LEUKOCYTES, NITRITE, OR GLUCOSE <1000 mg/dL.  CBC     Status: Normal   Collection Time   09/23/11 10:39 AM      Component Value Range Comment   WBC 7.8  4.0 - 10.5 (K/uL)    RBC 4.33   3.87 - 5.11 (MIL/uL)    Hemoglobin 12.8  12.0 - 15.0 (g/dL)    HCT 16.1  09.6 - 04.5 (%)    MCV 86.4  78.0 - 100.0 (fL)    MCH 29.6  26.0 - 34.0 (pg)    MCHC 34.2  30.0 - 36.0 (g/dL)    RDW 40.9  81.1 - 91.4 (%)    Platelets 213  150 - 400 (K/uL)   COMPREHENSIVE METABOLIC PANEL     Status: Abnormal   Collection Time   09/23/11 10:39 AM      Component Value Range Comment   Sodium 142  135 - 145 (mEq/L)    Potassium 4.6  3.5 - 5.1 (mEq/L)    Chloride 105  96 - 112 (mEq/L)    CO2 31  19 - 32 (mEq/L)    Glucose, Bld 97  70 - 99 (mg/dL)    BUN 13  6 - 23 (mg/dL)    Creatinine, Ser 7.82  0.50 - 1.10 (mg/dL)    Calcium 9.5  8.4 - 10.5 (mg/dL)    Total Protein  6.8  6.0 - 8.3 (g/dL)    Albumin 3.6  3.5 - 5.2 (g/dL)    AST 20  0 - 37 (U/L)    ALT 11  0 - 35 (U/L)    Alkaline Phosphatase 79  39 - 117 (U/L)    Total Bilirubin 0.2 (*) 0.3 - 1.2 (mg/dL)    GFR calc non Af Amer >90  >90 (mL/min)    GFR calc Af Amer >90  >90 (mL/min)   PROTIME-INR     Status: Normal   Collection Time   09/23/11 10:39 AM      Component Value Range Comment   Prothrombin Time 12.6  11.6 - 15.2 (seconds)    INR 0.92  0.00 - 1.49    SURGICAL PCR SCREEN     Status: Normal   Collection Time   09/23/11 11:41 AM      Component Value Range Comment   MRSA, PCR NEGATIVE  NEGATIVE     Staphylococcus aureus NEGATIVE  NEGATIVE      Chest 2 View  09/23/2011  *RADIOLOGY REPORT*  Clinical Data: Left tibial plateau fracture.  Preoperative evaluation.  CHEST - 2 VIEW  Comparison: Report dated 03/30/2000.  Findings: Normal sized heart.  Clear lungs.  Mild thoracic spine degenerative changes and mild scoliosis.  IMPRESSION: No acute abnormality.  Original Report Authenticated By: Darrol Angel, M.D.   labs to be drawn Monday  Review of Systems  Constitutional: Negative.   HENT: Negative.   Eyes: Negative.   Respiratory: Negative.   Cardiovascular: Negative.   Gastrointestinal: Negative.   Genitourinary: Negative.     Musculoskeletal: Positive for joint pain.       Previous Carpal tunnel release 2009  Skin: Negative.   Neurological: Negative.   Endo/Heme/Allergies: Negative.   Psychiatric/Behavioral: Negative.     Blood pressure 154/78, pulse 79, temperature 98.6 F (37 C), temperature source Oral, resp. rate 18, SpO2 98.00%. Physical Exam  Constitutional: She is oriented to person, place, and time. She appears well-developed and well-nourished.  HENT:  Head: Normocephalic and atraumatic.  Eyes: Pupils are equal, round, and reactive to light.  Neck: Normal range of motion.  Cardiovascular: Normal rate and regular rhythm.   No murmur heard. Respiratory: Effort normal and breath sounds normal. No respiratory distress. She has no wheezes.  GI: Soft.  Musculoskeletal:       Left knee hemarthrosis,  Pulses 2 plus  DP and PT no pain with hip ROM  Neurological: She is alert and oriented to person, place, and time.  Skin: Skin is warm and dry. No rash noted.  Psychiatric: She has a normal mood and affect.     Assessment/Plan Left depressed lateral tibial plateau fracture. Discussed plan and procedure , risks vs benefit of surgery, restoration of joint surface,  Risks of infection, bleeding , reoperation,  Stiffness, hardware problems,  Knee arthritis,  DVT etc among risks discussed.  She understands and requests we proceed.  All ?'s answered.   Dewan Emond C 09/23/2011, 3:10 PM

## 2011-09-24 LAB — GLUCOSE, CAPILLARY

## 2011-09-24 NOTE — Progress Notes (Signed)
PT/OT Cancellation Note  Treatment cancelled today due to patient's refusal to participate secondary to nausea and general fatigue Will attempt tomorrow.  09/24/2011 Milana Kidney DPT PAGER: [phone] OFFICE: [phone]  Margaretmary Eddy MS,OTR/L

## 2011-09-24 NOTE — Anesthesia Postprocedure Evaluation (Signed)
  Anesthesia Post-op Note  Patient: Rebecca Davis  Procedure(s) Performed:  OPEN REDUCTION INTERNAL FIXATION (ORIF) TIBIAL PLATEAU  Patient Location: PACU  Anesthesia Type: General  Level of Consciousness: awake, alert , oriented and patient cooperative  Airway and Oxygen Therapy: Patient Spontanous Breathing and Patient connected to nasal cannula oxygen  Post-op Pain: mild  Post-op Assessment: Post-op Vital signs reviewed, Patient's Cardiovascular Status Stable, Respiratory Function Stable, Patent Airway, No signs of Nausea or vomiting and Pain level controlled  Post-op Vital Signs: stable  Complications: No apparent anesthesia complications

## 2011-09-24 NOTE — Progress Notes (Signed)
OT orders received. Pt unable to participate in eval this morning due to c/o of severe pain, pt crying. Pt's nurse stated that she has given pt all the pain meds possible at this time. OT will re - check this afternoon  Lafe Garin, MS, OTR/L Acute Rehab Dept

## 2011-09-24 NOTE — Progress Notes (Signed)
Received PT orders.  Spoke with pt. She is currently crying with pain, and RN Nettie Elm states pt. Has been given all she can have for pain. Will defer PT this am and will attempt to see this pm.   Weldon Picking [facility]TA[facility] [phone] (pager)

## 2011-09-24 NOTE — Progress Notes (Signed)
Subjective: Pain controlled   Fair,   8 out of 10   Objective: Vital signs in last 24 hours: Temp:  [97.1 F (36.2 C)-98.7 F (37.1 C)] 98.5 F (36.9 C) (11/26 2133) Pulse Rate:  [79-97] 96  (11/26 2133) Resp:  [12-49] 18  (11/26 2133) BP: (136-169)/(63-81) 136/70 mmHg (11/26 2133) SpO2:  [97 %-100 %] 97 % (11/26 2133)  Intake/Output from previous day: 11/26 0701 - 11/27 0700 In: 2560 [P.O.:360; I.V.:2200] Out: 1200 [Urine:1200] Intake/Output this shift: Total I/O In: 1260 [P.O.:360; I.V.:900] Out: 950 [Urine:950]   Basename 09/23/11 1039  HGB 12.8    Basename 09/23/11 1039  WBC 7.8  RBC 4.33  HCT 37.4  PLT 213    Basename 09/23/11 1039  NA 142  K 4.6  CL 105  CO2 31  BUN 13  CREATININE 0.54  GLUCOSE 97  CALCIUM 9.5    Basename 09/23/11 1039  LABPT --  INR 0.92    Neurologically intact  Assessment/Plan: Therapy,  Possible home Wed.   Galilea Quito C 09/24/2011, 7:00 AM

## 2011-09-25 NOTE — Progress Notes (Signed)
CARE MANAGEMENT NOTE 09/25/2011  Patient:  Rebecca Davis, Rebecca Davis   Account Number:  [id number]  Date Initiated:  09/25/2011  Documentation initiated by:  Vance Peper  Subjective/Objective Assessment:   55 yr old female s/p ORIF left Tibia     Action/Plan:   Discharge planning. Spoke with patient. Choice offered.Pateint states she has a rolling walker at home.   Anticipated DC Date:  09/25/2011   Anticipated DC Plan:  HOME W HOME HEALTH SERVICES      DC Planning Services  CM consult      [facility] Choice  HOME HEALTH   Choice offered to / List presented to:  C-1 Patient        HH arranged  HH-2 PT      [facility] agency  [facility]   Status of service:  Completed, signed off Discharge Disposition:  HOME W HOME HEALTH SERVICES

## 2011-09-25 NOTE — Progress Notes (Signed)
Subjective: Pain 8 or 9 out of 10 last 24 hrs. Did not get OOB with PT yet. "pain has gotten a little better"  Nausea without vomiting.   Tearful yesterday.  Objective: Vital signs in last 24 hours: Temp:  [97.9 F (36.6 C)-98.8 F (37.1 C)] 98.8 F (37.1 C) (11/28 0539) Pulse Rate:  [74-76] 76  (11/28 0539) Resp:  [18] 18  (11/28 0539) BP: (98-135)/(59-76) 110/66 mmHg (11/28 0539) SpO2:  [93 %-97 %] 97 % (11/28 0539)  Intake/Output from previous day: 11/27 0701 - 11/28 0700 In: 945 [P.O.:570; I.V.:375] Out: -  Intake/Output this shift:     Basename 09/23/11 1039  HGB 12.8    Basename 09/23/11 1039  WBC 7.8  RBC 4.33  HCT 37.4  PLT 213    Basename 09/23/11 1039  NA 142  K 4.6  CL 105  CO2 31  BUN 13  CREATININE 0.54  GLUCOSE 97  CALCIUM 9.5    Basename 09/23/11 1039  LABPT --  INR 0.92    Neurologically intact ABD soft Neurovascular intact Sensation intact distally Intact pulses distally Dorsiflexion/Plantar flexion intact Incision: no drainage No cellulitis present Compartment soft  Assessment/Plan: PT today,  Possible home tomorrow.    Samreen Seltzer C 09/25/2011, 7:04 AM

## 2011-09-25 NOTE — Progress Notes (Signed)
Physical Therapy Evaluation Patient Details Name: Rebecca Davis MRN: [id number] DOB: 1956/09/14 Today's Date: 09/25/2011  Problem List:  Patient Active Problem List  Diagnoses  . SORE THROAT  . OSTEOPENIA  . Tibial plateau fracture    Past Medical History:  Past Medical History  Diagnosis Date  . PONV (postoperative nausea and vomiting)    Past Surgical History:  Past Surgical History  Procedure Date  . Vaginal hysterectomy     partial  . Carpal tunnel release 2008    right    PT Assessment/Plan/Recommendation PT Assessment Clinical Impression Statement: Pt presents with a medical diagnosis of left tibial plateau fx s/p ORIF along with the following impairments/deficits listed below. Pt will benefit from skilled PT in the acute care setting in order to maximize functional mobility for a safe d/c home pending progress PT Recommendation/Assessment: Patient will need skilled PT in the acute care venue PT Problem List: Decreased strength;Decreased range of motion;Decreased activity tolerance;Decreased mobility;Decreased knowledge of use of DME;Decreased safety awareness;Decreased knowledge of precautions;Pain Barriers to Discharge: Inaccessible home environment PT Therapy Diagnosis : Generalized weakness;Acute pain PT Plan PT Frequency: Min 5X/week PT Treatment/Interventions: DME instruction;Gait training;Stair training;Functional mobility training;Therapeutic exercise;Patient/family education PT Recommendation Follow Up Recommendations: Home health PT;24 hour supervision/assistance Equipment Recommended: Rolling walker with 5" wheels PT Goals  Acute Rehab PT Goals PT Goal Formulation: With patient Time For Goal Achievement: 2 weeks Pt will go Supine/Side to Sit: with modified independence PT Goal: Supine/Side to Sit - Progress: Progressing toward goal Pt will go Sit to Supine/Side: with modified independence PT Goal: Sit to Supine/Side - Progress: Progressing toward  goal Pt will Transfer Sit to Stand/Stand to Sit: with supervision PT Transfer Goal: Sit to Stand/Stand to Sit - Progress: Progressing toward goal Pt will Transfer Bed to Chair/Chair to Bed: with supervision PT Transfer Goal: Bed to Chair/Chair to Bed - Progress: Other (comment) (unassessed today) Pt will Ambulate: 16 - 50 feet;with supervision;with least restrictive assistive device PT Goal: Ambulate - Progress: Other (comment) (unassessed today) Pt will Go Up / Down Stairs: 3-5 stairs;with min assist;with rolling walker PT Goal: Up/Down Stairs - Progress: Other (comment) (unassessed today)  PT Evaluation Precautions/Restrictions  Restrictions Weight Bearing Restrictions: Yes LLE Weight Bearing: Non weight bearing Prior Functioning  Home Living Lives With: Alone Receives Help From: Family Type of Home: House Home Layout: Two level;Able to live on main level with bedroom/bathroom Home Access: Stairs to enter Entrance Stairs-Rails: None Entrance Stairs-Number of Steps: 3 Bathroom Shower/Tub: Health visitor: Standard Bathroom Accessibility: Yes How Accessible: Accessible via walker Home Adaptive Equipment: Crutches Prior Function Level of Independence: Independent with basic ADLs;Independent with homemaking with ambulation;Independent with gait;Independent with transfers Able to Take Stairs?: Yes Driving: Yes Vocation: Full time employment Cognition Cognition Arousal/Alertness: Awake/alert Overall Cognitive Status: Appears within functional limits for tasks assessed Orientation Level: Oriented X4 Sensation/Coordination Sensation Light Touch: Appears Intact Extremity Assessment RLE Assessment RLE Assessment: Within Functional Limits LLE Assessment LLE Assessment: Exceptions to WFL LLE AROM (degrees) Overall AROM Left Lower Extremity: Deficits;Due to precautions;Due to pain (Hip and Ankle WFL.) LLE Strength LLE Overall Strength: Deficits;Due to  precautions;Due to pain (Hip and Ankle WFL) Mobility (including Balance) Bed Mobility Bed Mobility: Yes Supine to Sit: HOB elevated (Comment degrees);With rails;3: Mod assist (HOB 40 degrees) Supine to Sit Details (indicate cue type and reason): VC for sequencing into sitting. Assist with LLE as well as minimal trunk control.  Transfers Transfers: No (upon sitting, pt with feeling of  malaise and nausea) Ambulation/Gait Ambulation/Gait: No    Exercise    End of Session PT - End of Session Equipment Utilized During Treatment: Gait belt;Left knee immobilizer Activity Tolerance: Patient limited by fatigue (pt limited by [facility]) Patient left: in bed;with call bell in reach (RN notified of pts status) Nurse Communication: Other (comment) (pt with nausea and malaise) General Behavior During Session: Lethargic Cognition: WFL for tasks performed  Milana Kidney 09/25/2011, 9:29 AM  09/25/2011 Milana Kidney DPT PAGER: [phone] OFFICE: [phone]

## 2011-09-25 NOTE — Plan of Care (Signed)
Problem: Phase I Progression Outcomes Goal: Pain controlled with appropriate interventions Outcome: Progressing Pt with pain decrease 7 /10 down to 5/10 with supine <>sit  EOB<> Chair. Mobility and repositioning decreased pain levels

## 2011-09-25 NOTE — Progress Notes (Signed)
Utilization review completed. Shirelle Tootle, RN, BSN. 09/25/11  

## 2011-09-25 NOTE — Progress Notes (Signed)
Occupational Therapy Evaluation Patient Details Name: Rebecca Davis MRN: [id number] DOB: 09-11-1956 Today's Date: 09/25/2011  Problem List:  Patient Active Problem List  Diagnoses  . SORE THROAT  . OSTEOPENIA  . Tibial plateau fracture    Past Medical History:  Past Medical History  Diagnosis Date  . PONV (postoperative nausea and vomiting)    Past Surgical History:  Past Surgical History  Procedure Date  . Vaginal hysterectomy     partial  . Carpal tunnel release 2008    right    OT Assessment/Plan/Recommendation OT Assessment Clinical Impression Statement: decreased balance, decrease activity tolerance OT Recommendation/Assessment: Patient will need skilled OT in the acute care venue OT Problem List: Decreased activity tolerance;Impaired balance (sitting and/or standing);Decreased safety awareness;Decreased knowledge of use of DME or AE;Pain Barriers to Discharge: Decreased caregiver support (lives alone) OT Therapy Diagnosis : Generalized weakness;Acute pain OT Plan OT Frequency: Min 2X/week OT Treatment/Interventions: Self-care/ADL training;DME and/or AE instruction;Therapeutic activities;Balance training;Patient/family education OT Recommendation Follow Up Recommendations: Skilled nursing facility (pt currently requires MOD A and would need MOD a to d/c home) Equipment Recommended: 3 in 1 bedside comode Individuals Consulted Consulted and Agree with Results and Recommendations: Patient OT Goals Acute Rehab OT Goals OT Goal Formulation: With patient Time For Goal Achievement: 2 weeks ADL Goals Pt Will Perform Lower Body Bathing: with set-up;Sit to stand from bed Pt Will Perform Lower Body Dressing: with set-up;Sit to stand from bed Pt Will Transfer to Toilet: with set-up;Stand pivot transfer;3-in-1 Pt Will Perform Toileting - Clothing Manipulation: with set-up;Sitting on 3-in-1 or toilet Pt Will Perform Toileting - Hygiene: with set-up;Sit to stand from  3-in-1/toilet Miscellaneous OT Goals Miscellaneous OT Goal #1: Pt will perform bed mobility with no rails  HOB 20 degrees or less S level as precursor to ADLs  OT Evaluation Precautions/Restrictions  Precautions Precautions: Fall Precaution Comments: NWB LLE Restrictions Weight Bearing Restrictions: Yes LLE Weight Bearing: Non weight bearing Prior Functioning Home Living Lives With: Alone Receives Help From: Family Type of Home: House Home Layout: Two level;Able to live on main level with bedroom/bathroom Home Access: Stairs to enter Entrance Stairs-Rails: None Entrance Stairs-Number of Steps: 3 Bathroom Shower/Tub: Health visitor: Standard Bathroom Accessibility: Yes How Accessible: Accessible via walker Home Adaptive Equipment: Crutches Prior Function Level of Independence: Independent with basic ADLs;Independent with homemaking with ambulation;Independent with gait;Independent with transfers Able to Take Stairs?: Yes Driving: Yes Vocation: Full time employment ADL ADL Eating/Feeding: Performed;Set up Where Assessed - Eating/Feeding: Chair Grooming: Performed;Teeth care;Wash/dry face;Set up Where Assessed - Grooming: Sitting, chair;Supported Location manager Bathing: Performed;Chest;Right arm;Left arm;Abdomen;Set up Where Assessed - Upper Body Bathing: Sitting, bed;Supported Upper Body Dressing: Performed;Set up Where Assessed - Upper Body Dressing: Sitting, chair;Supported Statistician: Performed;Moderate assistance Toilet Transfer Method:  (utilized bed pan and assisted off on arrival) Acupuncturist: Other (comment) (using RLE to bridge buttock) Toileting - Clothing Manipulation: Performed;Set up Where Assessed - Toileting Clothing Manipulation: Supine, head of bed flat Toileting - Hygiene: Performed;Set up Where Assessed - Toileting Hygiene: Supine, head of bed flat Equipment Used: Rolling walker Vision/Perception     Cognition Cognition Arousal/Alertness: Awake/alert Overall Cognitive Status: Appears within functional limits for tasks assessed Orientation Level: Oriented X4 Sensation/Coordination Coordination Gross Motor Movements are Fluid and Coordinated: Yes Fine Motor Movements are Fluid and Coordinated: Yes Extremity Assessment RUE Assessment RUE Assessment: Within Functional Limits LUE Assessment LUE Assessment: Within Functional Limits Mobility  Bed Mobility Bed Mobility: Yes Rolling Left: 3: Mod assist;With rail (  LLE supported throughout transfer) Left Sidelying to Sit: 3: Mod assist;With rails;HOB elevated (comment degrees);Other (comment) (HOB 30%) Supine to Sit: HOB elevated (Comment degrees);With rails;3: Mod assist Supine to Sit Details (indicate cue type and reason): min v/c, strong v/c for encouragement Transfers Transfers: Yes Sit to Stand: 3: Mod assist;With upper extremity assist;From bed Stand to Sit: 3: Mod assist;With upper extremity assist;To chair/3-in-1 (mod v/c for hand placement pt pulling at [facility]) Exercises   End of Session OT - End of Session Equipment Utilized During Treatment: Gait belt;Left knee immobilizer Activity Tolerance: Patient limited by pain Patient left: in chair;with call bell in reach Nurse Communication: Mobility status for transfers;Weight bearing status General Behavior During Session: [facility] for tasks performed Cognition: [facility] for tasks performed   Lucile Shutters 09/25/2011, 1:16 PM  Pager: [phone]

## 2011-09-26 ENCOUNTER — Encounter (HOSPITAL_COMMUNITY): Payer: Self-pay | Admitting: Orthopaedic Surgery

## 2011-09-26 MED ORDER — OXYCODONE-ACETAMINOPHEN 5-325 MG PO TABS: 1.0000 | ORAL_TABLET | ORAL | Status: AC | PRN

## 2011-09-26 MED ORDER — METHOCARBAMOL 500 MG PO TABS: 500.0000 mg | ORAL_TABLET | Freq: Four times a day (QID) | ORAL | Status: AC | PRN

## 2011-09-26 MED ORDER — ONDANSETRON HCL 4 MG PO TABS: 4.0000 mg | ORAL_TABLET | Freq: Four times a day (QID) | ORAL | Status: AC | PRN

## 2011-09-26 NOTE — Discharge Summary (Signed)
Patient ID: Rebecca Davis MRN: [id number] DOB/AGE: [birth year] 55 y.o.  Admit date: 09/23/2011 Discharge date: 09/26/2011  Admission Diagnoses:  Active Problems:  Tibial plateau fracture  Left Discharge Diagnoses: left tibial plateau fracture.  Same  Past Medical History  Diagnosis Date  . PONV (postoperative nausea and vomiting)     Surgeries: Procedure(s): OPEN REDUCTION INTERNAL FIXATION (ORIF) TIBIAL PLATEAU on 09/23/2011   Consultants:    Discharged Condition: Improved  Hospital Course: Rebecca Davis is an 55 y.o. female who was admitted 09/23/2011 for operative treatment of left tibial plateau fracture . Patient has severe unremitting pain that affects sleep, daily activities, and work/hobbies. After pre-op clearance the patient was taken to the operating room on 09/23/2011 and underwent  Procedure(s): OPEN REDUCTION INTERNAL FIXATION (ORIF) TIBIAL PLATEAU.    Patient was given perioperative antibiotics: Anti-infectives     Start     Dose/Rate Route Frequency Ordered Stop   09/23/11 2030   ceFAZolin (ANCEF) IVPB 1 g/50 mL premix        1 g 100 mL/hr over 30 Minutes Intravenous Every 6 hours 09/23/11 2006 09/24/11 0900   09/23/11 1030   ceFAZolin (ANCEF) IVPB 1 g/50 mL premix        1 g 100 mL/hr over 30 Minutes Intravenous 60 min pre-op 09/23/11 1023 09/23/11 1603   09/23/11 0000   ceFAZolin (ANCEF) 1 g in dextrose 5 % 50 mL IVPB  Status:  Discontinued        1 g 120 mL/hr over 30 Minutes Intravenous 60 min pre-op 09/22/11 1657 09/22/11 1703   09/22/11 1300   ceFAZolin (ANCEF) 1 g in dextrose 5 % 50 mL IVPB  Status:  Discontinued        1 g 120 mL/hr over 30 Minutes Intravenous 60 min pre-op 09/22/11 1247 09/22/11 1656           Patient was given sequential compression devices, early ambulation, and chemoprophylaxis to prevent DVT.  Patient benefited maximally from hospital stay and there were no complications.    Recent vital signs: Patient  Vitals for the past 24 hrs:  BP Temp Temp src Pulse Resp SpO2  09/26/11 0646 121/65 mmHg 98.6 F (37 C) Oral 62  18  99 %  09/25/11 2200 115/77 mmHg 99.3 F (37.4 C) Oral 73  19  95 %  09/25/11 1400 143/72 mmHg 99 F (37.2 C) - 72  16  -     Recent laboratory studies:  Basename 09/23/11 1039  WBC 7.8  HGB 12.8  HCT 37.4  PLT 213  NA 142  K 4.6  CL 105  CO2 31  BUN 13  CREATININE 0.54  GLUCOSE 97  INR 0.92  CALCIUM 9.5     Discharge Medications:  Current Discharge Medication List    START taking these medications   Details  methocarbamol (ROBAXIN) 500 MG tablet Take 1 tablet (500 mg total) by mouth every 6 (six) hours as needed (prn spasm). Qty: 40 tablet, Refills: 1    ondansetron (ZOFRAN) 4 MG tablet Take 1 tablet (4 mg total) by mouth every 6 (six) hours as needed for nausea. Qty: 40 tablet, Refills: 0    oxyCODONE-acetaminophen (PERCOCET) 5-325 MG per tablet Take 1-2 tablets by mouth every 4 (four) hours as needed for pain. Qty: 60 tablet, Refills: 0      CONTINUE these medications which have NOT CHANGED   Details  aspirin EC 325 MG tablet Take 325 mg by mouth 2 (  two) times daily.        STOP taking these medications     hydrocodone-acetaminophen (LORCET-HD) 5-500 MG per capsule         Diagnostic Studies:  Chest 2 View  09/23/2011  *RADIOLOGY REPORT*  Clinical Data: Left tibial plateau fracture.  Preoperative evaluation.  CHEST - 2 VIEW  Comparison: Report dated 03/30/2000.  Findings: Normal sized heart.  Clear lungs.  Mild thoracic spine degenerative changes and mild scoliosis.  IMPRESSION: No acute abnormality.  Original Report Authenticated By: Darrol Angel, M.D.   Dg Tibia/fibula Left  09/23/2011  *RADIOLOGY REPORT*  Clinical Data: ORIF left tibial plateau  LEFT TIBIA AND FIBULA - 2 VIEW  Comparison: CT left knee 09/18/2011  Findings: Three fluoroscopic spot views of the left knee show a lateral cortical plate and multiple screws in the proximal  tibia. Fracture fragments are in near anatomic alignment.  No acute abnormality or hardware complication is seen on these fluoroscopic views.  IMPRESSION: ORIF for left tibial plateau fracture.  No complicating feature.  Original Report Authenticated By: Britta Mccreedy, M.D.   Ct Knee Left Wo Contrast  09/18/2011  *RADIOLOGY REPORT*  Clinical Data: Tibial plateau fracture after injury involving a dog.  CT OF THE LEFT KNEE WITHOUT CONTRAST  Technique:  Multidetector CT imaging was performed according to the standard protocol. Multiplanar CT image reconstructions were also generated.  Comparison: Attempts were made to scan the patient's paper printouts of knee radiographs into the Streamline/Epic system, but these did not result in a useful comparison image.  Findings: Lipohemarthrosis noted associated with a mildly comminuted fracture of the lateral tibial plateau with longitudinal component extending into the proximal tibiofibular joint.  The anterior - central portion of the lateral tibial plateau is impacted by approximately 8 mm, with several small anterior fragments.  No cortical fragments are flipped into the fracture planes. The fracture involves the expected location of the attachment of the iliotibial band, although no proximal fragment retraction is observed.  Fracture extension into the lateral margin of the anterior corner of the lateral component of the tibial spine noted.  The fracture does not appear to extend into the expected distal attachment sites of the anterior cruciate ligament or posterior cruciate ligament.  No additional fracture is observed.  IMPRESSION:  1.  Lipohemarthrosis associated with a mildly comminuted fracture of the lateral tibial plateau.  A fracture plane extends into the proximal tibiofibular joint and a significant portion of the anterior lateral tibial plateau is impacted up to 8 mm.  Original Report Authenticated By: Dellia Cloud, M.D.   Dg C-arm 1-60  Min  09/23/2011  CLINICAL DATA: orif left tibia   C-ARM 1-60 MINUTES  Fluoroscopy was utilized by the requesting physician.  No radiographic  interpretation.      Disposition: Final discharge disposition not confirmed  Discharge Orders    Future Orders Please Complete By Expires   Care order/instruction      Scheduling Instructions:   Return office visit one week Dr. Ophelia Charter [phone] for appt   Knee elevation , ice on and off. NWB.   Comments:   D/C saline lock and all IV meds.   ASA for dvt prophylaxis.   Care order/instruction            Signed: Eldred Manges 09/26/2011, 8:12 AM

## 2011-09-26 NOTE — Progress Notes (Signed)
Occupational Therapy Treatment Patient Details Name: Rebecca Davis MRN: [id number] DOB: [birth year] Today's Date: 09/26/2011  OT Assessment/Plan OT Assessment/Plan OT Plan: Discharge plan needs to be updated OT Frequency: Min 2X/week Follow Up Recommendations: Home health OT Equipment Recommended: None recommended by OT OT Goals Acute Rehab OT Goals OT Goal Formulation: With patient Time For Goal Achievement: 2 weeks ADL Goals Pt Will Perform Lower Body Bathing: with set-up;Sit to stand from bed ADL Goal: Lower Body Bathing - Progress: Progressing toward goals Pt Will Perform Lower Body Dressing: with set-up;Sit to stand from bed ADL Goal: Lower Body Dressing - Progress: Progressing toward goals Pt Will Transfer to Toilet: with set-up;Stand pivot transfer;3-in-1 ADL Goal: Toilet Transfer - Progress: Met Pt Will Perform Toileting - Clothing Manipulation: with set-up;Sitting on 3-in-1 or toilet ADL Goal: Toileting - Clothing Manipulation - Progress: Met Pt Will Perform Toileting - Hygiene: with set-up;Sit to stand from 3-in-1/toilet ADL Goal: Toileting - Hygiene - Progress: Met Miscellaneous OT Goals Miscellaneous OT Goal #1: Pt will perform bed mobility with no rails  HOB 20 degrees or less S level as precursor to ADLs OT Goal: Miscellaneous Goal #1 - Progress: Progressing toward goals  OT Treatment Precautions/Restrictions  Precautions Precautions: Fall Precaution Comments: NWB LLE Restrictions Weight Bearing Restrictions: Yes LLE Weight Bearing: Non weight bearing   ADL ADL Grooming: Performed;Teeth care;Wash/dry face;Set up Where Assessed - Grooming: Sitting, chair;Supported Where Assessed - Lower Body Dressing:  (educated on dressing LLE first and type of LB clothing) Toilet Transfer: Research scientist (life sciences) Method: Surveyor, minerals: Set designer - Clothing Manipulation: Performed;Supervision/safety Where  Assessed - Glass blower/designer Manipulation: Sit to stand from 3-in-1 or toilet Toileting - Hygiene: Performed;Supervision/safety Where Assessed - Toileting Hygiene: Sit to stand from 3-in-1 or toilet Equipment Used: Rolling walker ADL Comments: pt able to maintain LLE NWB Mobility  Bed Mobility Bed Mobility: Yes Rolling Left: With rail;6: Modified independent (Device/Increase time) (educated on using sheet as leg lifter) Left Sidelying to Sit: 6: Modified independent (Device/Increase time);With rails;HOB flat Supine to Sit: 6: Modified independent (Device/Increase time);With rails Transfers Transfers: Yes Sit to Stand: 5: Supervision;From bed;With upper extremity assist Sit to Stand Details (indicate cue type and reason): VC for hand placement and safety to RW Stand to Sit: 5: Supervision;To chair/3-in-1;With upper extremity assist Stand to Sit Details: VC for hand placement Exercises    End of Session OT - End of Session Equipment Utilized During Treatment: Left knee immobilizer Activity Tolerance: Patient tolerated treatment well Patient left: in chair;with call bell in reach;with family/visitor present (daugther) Nurse Communication: Mobility status for transfers;Mobility status for ambulation General Behavior During Session: [name] for tasks performed Cognition: [name] for tasks performed  Lucile Shutters  09/26/2011, 1:04 PM Pager: [phone]

## 2011-09-26 NOTE — Progress Notes (Signed)
Physical Therapy Treatment Patient Details Name: Rebecca Davis MRN: [id number] DOB: 06-05-1956 Today's Date: 09/26/2011  PT Assessment/Plan  PT - Assessment/Plan Comments on Treatment Session: Pt felt better today and was able to ambulate as well as complete stairs. Pt is safe to d/c home with home health therapy. PT Plan: Discharge plan remains appropriate PT Frequency: Min 5X/week Follow Up Recommendations: Home health PT;24 hour supervision/assistance Equipment Recommended: None recommended by PT PT Goals  Acute Rehab PT Goals PT Goal Formulation: With patient Time For Goal Achievement: 2 weeks PT Goal: Supine/Side to Sit - Progress: Progressing toward goal PT Goal: Sit to Supine/Side - Progress: Progressing toward goal PT Transfer Goal: Sit to Stand/Stand to Sit - Progress: Progressing toward goal PT Transfer Goal: Bed to Chair/Chair to Bed - Progress: Progressing toward goal PT Goal: Ambulate - Progress: Progressing toward goal PT Goal: Up/Down Stairs - Progress: Met  PT Treatment Precautions/Restrictions  Precautions Precautions: Fall Precaution Comments: NWB LLE Restrictions Weight Bearing Restrictions: Yes LLE Weight Bearing: Non weight bearing Mobility (including Balance) Bed Mobility Bed Mobility: No Transfers Transfers: Yes Sit to Stand: 5: Supervision;From chair/3-in-1 Sit to Stand Details (indicate cue type and reason): VC for hand placement and safety to RW Stand to Sit: 5: Supervision;To chair/3-in-1 Stand to Sit Details: VC for hand placement Ambulation/Gait Ambulation/Gait: Yes Ambulation/Gait Assistance: 4: Min assist (Minguard assist) Ambulation/Gait Assistance Details (indicate cue type and reason): VC for proper gait sequencing(hopping). Pt with good safety awareness and was able to maintain weight bearing precautions Ambulation Distance (Feet): 25 Feet Assistive device: Rolling walker Gait Pattern: Step-to pattern Gait velocity: Decreased gait  speed Stairs: Yes Stairs Assistance: 4: Min assist Stairs Assistance Details (indicate cue type and reason): VC for proper stair sequencing. Attempted forwards with 2 rails as well as sideways, but pt found it easiest to complete stairs with RW and min assist backwards. Stair Management Technique: Backwards;With walker Number of Stairs: 4     Exercise    End of Session PT - End of Session Equipment Utilized During Treatment: Gait belt;Left knee immobilizer Activity Tolerance: Patient tolerated treatment well Patient left: in bed;with call bell in reach Nurse Communication: Mobility status for transfers;Mobility status for ambulation General Behavior During Session: [facility] for tasks performed Cognition: [facility] for tasks performed  Milana Kidney 09/26/2011, 11:39 AM 09/26/2011 Milana Kidney DPT PAGER: [phone] OFFICE: [phone]

## 2011-11-11 ENCOUNTER — Telehealth: Payer: Self-pay | Admitting: Internal Medicine

## 2011-11-11 NOTE — Telephone Encounter (Signed)
Diazepam 5mg //pt wants to get refill for this. However Dr. Cato Mulligan did not prescribe this originally.

## 2011-11-12 MED ORDER — DIAZEPAM 5 MG PO TABS: 5.0000 mg | ORAL_TABLET | Freq: Every day | ORAL | Status: AC | PRN

## 2011-11-12 NOTE — Telephone Encounter (Signed)
See Rx for diazepam---call into pharmacy

## 2011-11-12 NOTE — Telephone Encounter (Signed)
Pt only takes the Diazepam for vertigo, and has not had any since 2009 from Dr. Regino Schultze. [name].  She takes 5 mg. q 4-6 hours prn dizziness.

## 2011-11-12 NOTE — Telephone Encounter (Signed)
Rebecca Davis has she been on the medication? What dose? How frequent?

## 2011-11-12 NOTE — Telephone Encounter (Signed)
Called to pharmacy, [name])

## 2011-12-31 ENCOUNTER — Ambulatory Visit
Admission: RE | Admit: 2011-12-31 | Discharge: 2011-12-31 | Disposition: A | Discharge status: Home / Self Care | Payer: Private Health Insurance - Indemnity | Source: Ambulatory Visit | Attending: Orthopaedic Surgery | Admitting: Orthopaedic Surgery

## 2011-12-31 ENCOUNTER — Other Ambulatory Visit: Payer: Self-pay | Admitting: Orthopaedic Surgery

## 2011-12-31 DIAGNOSIS — M79669 Pain in unspecified lower leg: Secondary | ICD-10-CM

## 2012-08-31 ENCOUNTER — Ambulatory Visit (INDEPENDENT_AMBULATORY_CARE_PROVIDER_SITE_OTHER): Payer: BC Managed Care – PPO | Admitting: Internal Medicine

## 2012-08-31 DIAGNOSIS — Z23 Encounter for immunization: Secondary | ICD-10-CM

## 2012-08-31 NOTE — Addendum Note (Signed)
Addended by: Alfred Levins D on: 08/31/2012 04:07 PM   Modules accepted: Orders

## 2012-09-03 ENCOUNTER — Ambulatory Visit: Payer: BC Managed Care – PPO | Admitting: Internal Medicine

## 2012-10-28 LAB — HM MAMMOGRAPHY

## 2013-03-01 ENCOUNTER — Encounter: Payer: Self-pay | Admitting: Internal Medicine

## 2013-03-01 ENCOUNTER — Ambulatory Visit (INDEPENDENT_AMBULATORY_CARE_PROVIDER_SITE_OTHER): Payer: BC Managed Care – PPO | Admitting: Internal Medicine

## 2013-03-01 VITALS — BP 122/74 | HR 64 | Temp 98.0°F | Ht 63.0 in | Wt 123.0 lb

## 2013-03-01 DIAGNOSIS — Z0000 Encounter for general adult medical examination without abnormal findings: Secondary | ICD-10-CM

## 2013-03-01 LAB — CBC WITH DIFFERENTIAL/PLATELET
Basophils Absolute: 0 10*3/uL (ref 0.0–0.1)
Eosinophils Absolute: 0.1 10*3/uL (ref 0.0–0.7)
Hemoglobin: 13.2 g/dL (ref 12.0–15.0)
Lymphocytes Relative: 24.1 % (ref 12.0–46.0)
Lymphs Abs: 1.6 10*3/uL (ref 0.7–4.0)
MCHC: 34.2 g/dL (ref 30.0–36.0)
Monocytes Absolute: 0.5 10*3/uL (ref 0.1–1.0)
Neutro Abs: 4.4 10*3/uL (ref 1.4–7.7)
RDW: 14.1 % (ref 11.5–14.6)

## 2013-03-01 LAB — HEPATIC FUNCTION PANEL
AST: 26 U/L (ref 0–37)
Albumin: 4.2 g/dL (ref 3.5–5.2)
Alkaline Phosphatase: 72 U/L (ref 39–117)

## 2013-03-01 LAB — BASIC METABOLIC PANEL
BUN: 18 mg/dL (ref 6–23)
Calcium: 9.2 mg/dL (ref 8.4–10.5)
Chloride: 103 mEq/L (ref 96–112)
Creatinine, Ser: 0.6 mg/dL (ref 0.4–1.2)

## 2013-03-01 LAB — POCT URINALYSIS DIPSTICK
Bilirubin, UA: NEGATIVE
Glucose, UA: NEGATIVE
Ketones, UA: NEGATIVE
Leukocytes, UA: NEGATIVE

## 2013-03-01 LAB — LIPID PANEL: HDL: 59.6 mg/dL (ref >39.00)

## 2013-03-01 NOTE — Progress Notes (Signed)
Patient ID: Rebecca Davis, female   DOB: Nov 28, 1955, 57 y.o.   MRN: [id number]  cpx  Past Medical History  Diagnosis Date  . PONV (postoperative nausea and vomiting)     History   Social History  . Marital Status: Legally Separated    Spouse Name: N/A    Number of Children: N/A  . Years of Education: N/A   Occupational History  . Not on file.   Social History Main Topics  . Smoking status: Never Smoker   . Smokeless tobacco: Not on file  . Alcohol Use:      Comment: rarely  . Drug Use: No  . Sexually Active: Not on file     Comment: hysterectomy-partial   Other Topics Concern  . Not on file   Social History Narrative  . No narrative on file    Past Surgical History  Procedure Laterality Date  . Vaginal hysterectomy      partial  . Carpal tunnel release  2008    right  . Orif tibia plateau  09/23/2011    Procedure: OPEN REDUCTION INTERNAL FIXATION (ORIF) TIBIAL PLATEAU;  Surgeon: Eldred Manges;  Location: [facility] OR;  Service: Orthopedics;  Laterality: Left;    Family History  Problem Relation Age of Onset  . Anesthesia problems Mother     Allergies  Allergen Reactions  . Codeine Nausea And Vomiting  . Other Other (See Comments)    Vomiting with anesthesia with hysterectomy 20 years ago.    Current Outpatient Prescriptions on File Prior to Visit  Medication Sig Dispense Refill  . aspirin EC 325 MG tablet Take 325 mg by mouth 2 (two) times daily.         No current facility-administered medications on file prior to visit.     patient denies chest pain, shortness of breath, orthopnea. Denies lower extremity edema, abdominal pain, change in appetite, change in bowel movements. Patient denies rashes, musculoskeletal complaints. No other specific complaints in a complete review of systems.   There were no vitals taken for this visit.   Well-developed well-nourished female in no acute distress. HEENT exam atraumatic, normocephalic, extraocular muscles are  intact. Braces in mouth.  Neck is supple. No jugular venous distention no thyromegaly. Chest clear to auscultation without increased work of breathing. Cardiac exam S1 and S2 are regular. Abdominal exam active bowel sounds, soft, nontender. Extremities no edema. Neurologic exam she is alert without any motor sensory deficits. Gait is normal.   Well Visit: health maint UTD

## 2013-09-13 ENCOUNTER — Ambulatory Visit (INDEPENDENT_AMBULATORY_CARE_PROVIDER_SITE_OTHER): Payer: BC Managed Care – PPO | Admitting: Internal Medicine

## 2013-09-13 ENCOUNTER — Encounter: Payer: Self-pay | Admitting: Internal Medicine

## 2013-09-13 VITALS — BP 130/86 | HR 86 | Temp 98.0°F | Resp 20 | Wt 125.0 lb

## 2013-09-13 DIAGNOSIS — J069 Acute upper respiratory infection, unspecified: Secondary | ICD-10-CM

## 2013-09-13 MED ORDER — HYDROCODONE-HOMATROPINE 5-1.5 MG/5ML PO SYRP: 5.0000 mL | ORAL_SOLUTION | Freq: Four times a day (QID) | ORAL | Status: AC | PRN

## 2013-09-13 NOTE — Patient Instructions (Signed)
Acute bronchitis symptoms are generally not helped by antibiotics.  Take over-the-counter expectorants and cough medications such as  Mucinex DM.  Call if there is no improvement in 5 to 7 days or if he developed worsening cough, fever, or new symptoms, such as shortness of breath or chest pain. 

## 2013-09-13 NOTE — Progress Notes (Signed)
Subjective:    Patient ID: Rebecca Davis, female    DOB: 1956-05-30, 57 y.o.   MRN: [id number]  HPI 23 -year-old patient Presents 2 week history of sore throat and cough. She has some associated nasal congestion. She became ill 2 days after exposure to a grandchild who had acute illness. Cough is nonproductive. No fever chills chest pain shortness of breath or productive cough.  Past Medical History  Diagnosis Date  . PONV (postoperative nausea and vomiting)     History   Social History  . Marital Status: Legally Separated    Spouse Name: N/A    Number of Children: N/A  . Years of Education: N/A   Occupational History  . Not on file.   Social History Main Topics  . Smoking status: Never Smoker   . Smokeless tobacco: Not on file  . Alcohol Use:      Comment: rarely  . Drug Use: No  . Sexual Activity: Not on file     Comment: hysterectomy-partial   Other Topics Concern  . Not on file   Social History Narrative  . No narrative on file    Past Surgical History  Procedure Laterality Date  . Vaginal hysterectomy      partial  . Carpal tunnel release  2008    right  . Orif tibia plateau  09/23/2011    Procedure: OPEN REDUCTION INTERNAL FIXATION (ORIF) TIBIAL PLATEAU;  Surgeon: Eldred Manges;  Location: [facility];  Service: Orthopedics;  Laterality: Left;    Family History  Problem Relation Age of Onset  . Anesthesia problems Mother     Allergies  Allergen Reactions  . Codeine Nausea And Vomiting  . Other Other (See Comments)    Vomiting with anesthesia with hysterectomy 20 years ago.    No current outpatient prescriptions on file prior to visit.   No current facility-administered medications on file prior to visit.    BP 130/86  Pulse 86  Temp(Src) 98 F (36.7 C) (Oral)  Resp 20  Wt 125 lb (56.7 kg)  SpO2 98%       Review of Systems  Constitutional: Positive for activity change, appetite change and fatigue.  HENT: Positive for sore throat.  Negative for congestion, dental problem, hearing loss, rhinorrhea, sinus pressure and tinnitus.   Eyes: Negative for pain, discharge and visual disturbance.  Respiratory: Positive for cough. Negative for shortness of breath.   Cardiovascular: Negative for chest pain, palpitations and leg swelling.  Gastrointestinal: Negative for nausea, vomiting, abdominal pain, diarrhea, constipation, blood in stool and abdominal distention.  Genitourinary: Negative for dysuria, urgency, frequency, hematuria, flank pain, vaginal bleeding, vaginal discharge, difficulty urinating, vaginal pain and pelvic pain.  Musculoskeletal: Negative for arthralgias, gait problem and joint swelling.  Skin: Negative for rash.  Neurological: Negative for dizziness, syncope, speech difficulty, weakness, numbness and headaches.  Hematological: Negative for adenopathy.  Psychiatric/Behavioral: Negative for behavioral problems, dysphoric mood and agitation. The patient is not nervous/anxious.        Objective:   Physical Exam  Constitutional: She is oriented to person, place, and time. She appears well-developed and well-nourished.  HENT:  Head: Normocephalic.  Right Ear: External ear normal.  Left Ear: External ear normal.  Mouth/Throat: Oropharynx is clear and moist.  Eyes: Conjunctivae and EOM are normal. Pupils are equal, round, and reactive to light.  Neck: Normal range of motion. Neck supple. No thyromegaly present.  Cardiovascular: Normal rate, regular rhythm, normal heart sounds and intact distal pulses.  Pulmonary/Chest: Effort normal and breath sounds normal. No respiratory distress. She has no wheezes. She has no rales.  O2 saturation 98  Abdominal: Soft. Bowel sounds are normal. She exhibits no mass. There is no tenderness.  Musculoskeletal: Normal range of motion.  Lymphadenopathy:    She has no cervical adenopathy.  Neurological: She is alert and oriented to person, place, and time.  Skin: Skin is warm and  dry. No rash noted.  Psychiatric: She has a normal mood and affect. Her behavior is normal.          Assessment & Plan:  Viral URI with cough. Will treat symptomatically

## 2015-12-28 ENCOUNTER — Ambulatory Visit: Payer: Self-pay | Admitting: Gynecology

## 2016-07-15 DIAGNOSIS — Z23 Encounter for immunization: Secondary | ICD-10-CM | POA: Diagnosis not present

## 2016-08-27 DIAGNOSIS — H5213 Myopia, bilateral: Secondary | ICD-10-CM | POA: Diagnosis not present

## 2016-12-02 DIAGNOSIS — Z1231 Encounter for screening mammogram for malignant neoplasm of breast: Secondary | ICD-10-CM | POA: Diagnosis not present

## 2016-12-19 DIAGNOSIS — Z0000 Encounter for general adult medical examination without abnormal findings: Secondary | ICD-10-CM | POA: Diagnosis not present

## 2016-12-19 DIAGNOSIS — E559 Vitamin D deficiency, unspecified: Secondary | ICD-10-CM | POA: Diagnosis not present

## 2016-12-24 DIAGNOSIS — M810 Age-related osteoporosis without current pathological fracture: Secondary | ICD-10-CM | POA: Diagnosis not present

## 2016-12-24 DIAGNOSIS — Z0000 Encounter for general adult medical examination without abnormal findings: Secondary | ICD-10-CM | POA: Diagnosis not present

## 2017-03-04 DIAGNOSIS — M19011 Primary osteoarthritis, right shoulder: Secondary | ICD-10-CM | POA: Diagnosis not present

## 2017-03-04 DIAGNOSIS — M25511 Pain in right shoulder: Secondary | ICD-10-CM | POA: Diagnosis not present

## 2017-03-12 ENCOUNTER — Encounter: Payer: Self-pay | Admitting: Gynecology

## 2017-06-10 DIAGNOSIS — M7521 Bicipital tendinitis, right shoulder: Secondary | ICD-10-CM | POA: Diagnosis not present

## 2017-06-10 DIAGNOSIS — M7541 Impingement syndrome of right shoulder: Secondary | ICD-10-CM | POA: Diagnosis not present

## 2017-06-19 DIAGNOSIS — M75111 Incomplete rotator cuff tear or rupture of right shoulder, not specified as traumatic: Secondary | ICD-10-CM | POA: Diagnosis not present

## 2017-06-19 DIAGNOSIS — M7551 Bursitis of right shoulder: Secondary | ICD-10-CM | POA: Diagnosis not present

## 2017-06-19 DIAGNOSIS — M65811 Other synovitis and tenosynovitis, right shoulder: Secondary | ICD-10-CM | POA: Diagnosis not present

## 2017-06-19 DIAGNOSIS — M19011 Primary osteoarthritis, right shoulder: Secondary | ICD-10-CM | POA: Diagnosis not present

## 2017-08-06 DIAGNOSIS — B029 Zoster without complications: Secondary | ICD-10-CM | POA: Diagnosis not present

## 2017-08-06 DIAGNOSIS — Z23 Encounter for immunization: Secondary | ICD-10-CM | POA: Diagnosis not present

## 2017-08-06 DIAGNOSIS — H6012 Cellulitis of left external ear: Secondary | ICD-10-CM | POA: Diagnosis not present

## 2017-12-04 DIAGNOSIS — Z1231 Encounter for screening mammogram for malignant neoplasm of breast: Secondary | ICD-10-CM | POA: Diagnosis not present

## 2018-01-05 DIAGNOSIS — E559 Vitamin D deficiency, unspecified: Secondary | ICD-10-CM | POA: Diagnosis not present

## 2018-01-05 DIAGNOSIS — Z0000 Encounter for general adult medical examination without abnormal findings: Secondary | ICD-10-CM | POA: Diagnosis not present

## 2018-01-05 DIAGNOSIS — M810 Age-related osteoporosis without current pathological fracture: Secondary | ICD-10-CM | POA: Diagnosis not present

## 2018-01-06 DIAGNOSIS — R945 Abnormal results of liver function studies: Secondary | ICD-10-CM | POA: Diagnosis not present

## 2018-01-06 DIAGNOSIS — R749 Abnormal serum enzyme level, unspecified: Secondary | ICD-10-CM | POA: Diagnosis not present

## 2018-01-06 DIAGNOSIS — D649 Anemia, unspecified: Secondary | ICD-10-CM | POA: Diagnosis not present

## 2018-01-08 DIAGNOSIS — M810 Age-related osteoporosis without current pathological fracture: Secondary | ICD-10-CM | POA: Diagnosis not present

## 2018-01-08 DIAGNOSIS — Z0000 Encounter for general adult medical examination without abnormal findings: Secondary | ICD-10-CM | POA: Diagnosis not present

## 2018-01-08 DIAGNOSIS — D179 Benign lipomatous neoplasm, unspecified: Secondary | ICD-10-CM | POA: Diagnosis not present

## 2018-01-08 DIAGNOSIS — R740 Nonspecific elevation of levels of transaminase and lactic acid dehydrogenase [LDH]: Secondary | ICD-10-CM | POA: Diagnosis not present

## 2018-01-08 DIAGNOSIS — Z8781 Personal history of (healed) traumatic fracture: Secondary | ICD-10-CM | POA: Diagnosis not present

## 2018-01-27 DIAGNOSIS — R740 Nonspecific elevation of levels of transaminase and lactic acid dehydrogenase [LDH]: Secondary | ICD-10-CM | POA: Diagnosis not present

## 2018-03-03 DIAGNOSIS — Z1212 Encounter for screening for malignant neoplasm of rectum: Secondary | ICD-10-CM | POA: Diagnosis not present

## 2018-03-03 DIAGNOSIS — Z01419 Encounter for gynecological examination (general) (routine) without abnormal findings: Secondary | ICD-10-CM | POA: Diagnosis not present

## 2018-03-03 DIAGNOSIS — M810 Age-related osteoporosis without current pathological fracture: Secondary | ICD-10-CM | POA: Diagnosis not present

## 2018-04-10 ENCOUNTER — Ambulatory Visit (INDEPENDENT_AMBULATORY_CARE_PROVIDER_SITE_OTHER): Payer: BLUE CROSS/BLUE SHIELD | Admitting: Orthopedic Surgery

## 2018-04-10 DIAGNOSIS — G8929 Other chronic pain: Secondary | ICD-10-CM

## 2018-04-10 DIAGNOSIS — M25511 Pain in right shoulder: Secondary | ICD-10-CM

## 2018-04-11 ENCOUNTER — Encounter (INDEPENDENT_AMBULATORY_CARE_PROVIDER_SITE_OTHER): Payer: Self-pay | Admitting: Orthopedic Surgery

## 2018-04-11 NOTE — Progress Notes (Signed)
Office Visit Note   Patient: Rebecca Davis           Date of Birth: 15-May-1956           MRN: [id number] Visit Date: 04/10/2018 Requested by: Lisabeth Pick, MD [place], [place] [address] PCP: Deland Pretty, MD  Subjective: Chief Complaint  Patient presents with  . Shoulder Pain    HPI: Patient presents for evaluation of right shoulder pain.  She was doing a lot of working out about a year ago and developed anterior shoulder pain.  She says who recommended rest and gave what sounds like an sheath injection.  This was done without ultrasound.  Had excellent results from that injection and it lasted for about 3 months until symptoms recurred.  He had a second injection which also gave her about 3 months of relief.  Symptoms recurred also after that and she had a third shot which sounds like it was more of a subacromial injection from the back.  She describes "something is getting hung up" reports popping and noise in the shoulder.  Reports decreased range of motion due to the anterior pain and the pain also radiates down the biceps region.  She has had an MRI scan which was not an arthrogram done about 8 months ago which showed partial-thickness cuff tearing no definite biceps tendon pathology.              ROS: All systems reviewed are negative as they relate to the chief complaint within the history of present illness.  Patient denies  fevers or chills.   Assessment & Plan: Visit Diagnoses:  1. Chronic right shoulder pain     Plan: Pression is right shoulder pain and popping wants to 2 out of 3 injections.  I do not think any further injections are indicated.  I think the patient really would like to have a clear picture of exactly what is going on in her shoulder.  Based on exam and history today as well as ultrasound examination I think she may have possibly some subluxation of the biceps tendon.  No evidence of frozen shoulder.  Clarity prior to surgical  intervention and MRI arthrogram might help [place].  I think she is likely heading for arthroscopic evaluation and possible biceps tenodesis.  I will see her back after that study.  Follow-Up Instructions: Return for after MRI.   Orders:  Orders Placed This Encounter  Procedures  . MR SHOULDER RIGHT W CONTRAST  . Arthrogram   No orders of the defined types were placed in this encounter.     Procedures: No procedures performed   Clinical Data: No additional findings.  Objective: Vital Signs: There were no vitals taken for this visit.  Physical Exam:   Constitutional: Patient appears well-developed HEENT:  Head: Normocephalic Eyes:EOM are normal Neck: Normal range of motion Cardiovascular: Normal rate Pulmonary/chest: Effort normal Neurologic: Patient is alert Skin: Skin is warm Psychiatric: Patient has normal mood and affect    Ortho Exam: Orthopedic exam demonstrates good cervical spine range of motion.  5 out of 5 grip EPL FPL interosseous wrist flexion wrist extension bicep triceps and deltoid strength.  No real restriction of passive external rotation at 15 degrees of abduction and forward flexion is also symmetric to within about 5 degrees right versus left.  She has good rotator cuff strength testing bilaterally to infraspinatus supraspinatus and subscap testing.  No real discrete AC joint tenderness to direct palpation or  with crossarm adduction on the right-hand side.  Ultrasound examination of the biceps tendon subscap and supraspinatus show generally intact rotator cuff attachment sites but with possible medial subluxation of the biceps tendon statically.  Specialty Comments:  No specialty comments available.  Imaging: No results found.   PMFS History: Patient Active Problem List   Diagnosis Date Noted  . OSTEOPENIA 07/14/2008   Past Medical History:  Diagnosis Date  . PONV (postoperative nausea and vomiting)     Family History  Problem Relation Age of  Onset  . Anesthesia problems Mother     Past Surgical History:  Procedure Laterality Date  . CARPAL TUNNEL RELEASE  2008   right  . ORIF TIBIA PLATEAU  09/23/2011   Procedure: OPEN REDUCTION INTERNAL FIXATION (ORIF) TIBIAL PLATEAU;  Surgeon: Marybelle Killings;  Location: [facility];  Service: Orthopedics;  Laterality: Left;  Marland Kitchen VAGINAL HYSTERECTOMY     partial   Social History   Occupational History  . Not on file  Tobacco Use  . Smoking status: Never Smoker  Substance and Sexual Activity  . Alcohol use: Not on file    Comment: rarely  . Drug use: No  . Sexual activity: Not on file    Comment: hysterectomy-partial

## 2018-04-23 ENCOUNTER — Ambulatory Visit
Admission: RE | Admit: 2018-04-23 | Discharge: 2018-04-23 | Disposition: A | Discharge status: Home / Self Care | Payer: BLUE CROSS/BLUE SHIELD | Source: Ambulatory Visit | Attending: Orthopedic Surgery | Admitting: Orthopedic Surgery

## 2018-04-23 DIAGNOSIS — M25511 Pain in right shoulder: Principal | ICD-10-CM

## 2018-04-23 DIAGNOSIS — S43431A Superior glenoid labrum lesion of right shoulder, initial encounter: Secondary | ICD-10-CM | POA: Diagnosis not present

## 2018-04-23 DIAGNOSIS — G8929 Other chronic pain: Secondary | ICD-10-CM

## 2018-04-23 MED ORDER — IOPAMIDOL (ISOVUE-M 200) INJECTION 41%
12.0000 mL | Freq: Once | INTRAMUSCULAR | Status: AC
  Administered 2018-04-23: 12 mL via INTRA_ARTICULAR

## 2018-04-27 ENCOUNTER — Encounter (INDEPENDENT_AMBULATORY_CARE_PROVIDER_SITE_OTHER): Payer: Self-pay | Admitting: Orthopedic Surgery

## 2018-04-27 ENCOUNTER — Ambulatory Visit (INDEPENDENT_AMBULATORY_CARE_PROVIDER_SITE_OTHER): Payer: BLUE CROSS/BLUE SHIELD | Admitting: Orthopedic Surgery

## 2018-04-27 DIAGNOSIS — M19011 Primary osteoarthritis, right shoulder: Secondary | ICD-10-CM

## 2018-04-28 ENCOUNTER — Encounter (INDEPENDENT_AMBULATORY_CARE_PROVIDER_SITE_OTHER): Payer: Self-pay | Admitting: Orthopedic Surgery

## 2018-04-28 NOTE — Progress Notes (Signed)
Office Visit Note   Patient: Rebecca Davis           Date of Birth: 02-17-1956           MRN: [id number] Visit Date: 04/27/2018 Requested by: Deland Pretty, MD [address] [place], [place] [address] PCP: Deland Pretty, MD  Subjective: Chief Complaint  Patient presents with  . Right Shoulder - Follow-up    HPI: Rebecca Davis is a patient with right shoulder pain.  Since I have seen her she has had a right shoulder MRI scan.  She did have an accident on a horse years ago.  She has a known diagnosis of osteoporosis.  She has been told that she needs to exercise.  MRI scan is reviewed with the patient.  Shows fairly extensive labral tearing as well as posterior glenoid arthritis and intact rotator cuff.              ROS: All systems reviewed are negative as they relate to the chief complaint within the history of present illness.  Patient denies  fevers or chills.   Assessment & Plan: Visit Diagnoses:  1. Arthritis of right shoulder region     Plan: Impression is symptomatic right shoulder degenerative labral tearing and early posterior glenohumeral arthritis.  Plan is to be modification.  We talked about an injection today which she wants to hold off on.  I think if it ever comes to intervention I would try arthroscopy and labral debridement with biceps tenodesis first before proceeding with any type of arthroplasty.  I did write for her void which would stress the posterior aspect of that shoulder.  Some of the mechanical symptoms she is having in her shoulder are related most likely to the labral pathology.  Her back as needed.  Follow-Up Instructions: Return if symptoms worsen or fail to improve.   Orders:  No orders of the defined types were placed in this encounter.  No orders of the defined types were placed in this encounter.     Procedures: No procedures performed   Clinical Data: No additional findings.  Objective: Vital Signs: There were no vitals  taken for this visit.  Physical Exam:   Constitutional: Patient appears well-developed HEENT:  Head: Normocephalic Eyes:EOM are normal Neck: Normal range of motion Cardiovascular: Normal rate Pulmonary/chest: Effort normal Neurologic: Patient is alert Skin: Skin is warm Psychiatric: Patient has normal mood and affect    Ortho Exam: Orthopedic exam demonstrates good cervical spine range of motion.  She has pretty reasonable shoulder range of motion but some coarseness is present with internal and external rotation particularly internal rotation at 90 degrees of abduction.  O'Brien's testing positive on the right negative on the left.  Rotator cuff strength is good to infraspinatus supraspinatus and subscap muscle testing.  She does have slightly more external rotation on the left compared to the right.  Motor or sensory function to the hand is intact.  Radial pulse intact  Specialty Comments:  No specialty comments available.  Imaging: No results found.   PMFS History: Patient Active Problem List   Diagnosis Date Noted  . OSTEOPENIA 07/14/2008   Past Medical History:  Diagnosis Date  . PONV (postoperative nausea and vomiting)     Family History  Problem Relation Age of Onset  . Anesthesia problems Mother     Past Surgical History:  Procedure Laterality Date  . CARPAL TUNNEL RELEASE  2008   right  . ORIF TIBIA PLATEAU  09/23/2011  Procedure: OPEN REDUCTION INTERNAL FIXATION (ORIF) TIBIAL PLATEAU;  Surgeon: Marybelle Killings;  Location: [facility];  Service: Orthopedics;  Laterality: Left;  Marland Kitchen VAGINAL HYSTERECTOMY     partial   Social History   Occupational History  . Not on file  Tobacco Use  . Smoking status: Never Smoker  . Smokeless tobacco: Never Used  Substance and Sexual Activity  . Alcohol use: Not on file    Comment: rarely  . Drug use: No  . Sexual activity: Not on file    Comment: hysterectomy-partial

## 2018-05-12 ENCOUNTER — Encounter: Payer: Self-pay | Admitting: Internal Medicine

## 2018-06-04 DIAGNOSIS — M25511 Pain in right shoulder: Secondary | ICD-10-CM | POA: Diagnosis not present

## 2018-08-19 DIAGNOSIS — Z23 Encounter for immunization: Secondary | ICD-10-CM | POA: Diagnosis not present

## 2018-08-27 DIAGNOSIS — H40053 Ocular hypertension, bilateral: Secondary | ICD-10-CM | POA: Diagnosis not present

## 2018-08-27 DIAGNOSIS — H5213 Myopia, bilateral: Secondary | ICD-10-CM | POA: Diagnosis not present

## 2018-08-27 DIAGNOSIS — H40013 Open angle with borderline findings, low risk, bilateral: Secondary | ICD-10-CM | POA: Diagnosis not present

## 2019-02-18 DIAGNOSIS — K529 Noninfective gastroenteritis and colitis, unspecified: Secondary | ICD-10-CM | POA: Diagnosis not present

## 2019-04-05 MED FILL — TRIAMTERENE/HCTZ 37.5/25 TB: 37.5-25 | 90 days supply | Qty: 90 | Fill #0

## 2019-04-28 MED FILL — ALENDRONATE NA 70 MG TAB: 70 | 56 days supply | Qty: 8 | Fill #0

## 2019-06-24 MED FILL — ALENDRONATE NA 70 MG TAB: 70 | 56 days supply | Qty: 8 | Fill #0

## 2019-06-25 MED FILL — TRIAMTERENE/HCTZ 37.5/25 TB: 37.5-25 | 60 days supply | Qty: 60 | Fill #1

## 2019-08-16 MED FILL — ALENDRONATE NA 70 MG TAB: 70 | 56 days supply | Qty: 8 | Fill #1

## 2019-08-25 DIAGNOSIS — Z1231 Encounter for screening mammogram for malignant neoplasm of breast: Secondary | ICD-10-CM | POA: Diagnosis not present

## 2019-08-26 DIAGNOSIS — H00012 Hordeolum externum right lower eyelid: Secondary | ICD-10-CM | POA: Diagnosis not present

## 2019-08-26 MED FILL — CEPHALEXIN 500 MG CAPSULE: 500 | 10 days supply | Qty: 20 | Fill #0

## 2019-09-02 DIAGNOSIS — M810 Age-related osteoporosis without current pathological fracture: Secondary | ICD-10-CM | POA: Diagnosis not present

## 2019-09-02 DIAGNOSIS — Z0000 Encounter for general adult medical examination without abnormal findings: Secondary | ICD-10-CM | POA: Diagnosis not present

## 2019-09-03 MED FILL — TRIAMTERENE-HCTZ 37.5-25 MG: 37.5-25 | 90 days supply | Qty: 90 | Fill #0

## 2019-09-22 DIAGNOSIS — H2513 Age-related nuclear cataract, bilateral: Secondary | ICD-10-CM | POA: Diagnosis not present

## 2019-09-22 DIAGNOSIS — H5213 Myopia, bilateral: Secondary | ICD-10-CM | POA: Diagnosis not present

## 2019-09-22 DIAGNOSIS — H40003 Preglaucoma, unspecified, bilateral: Secondary | ICD-10-CM | POA: Diagnosis not present

## 2019-09-22 DIAGNOSIS — H52223 Regular astigmatism, bilateral: Secondary | ICD-10-CM | POA: Diagnosis not present

## 2019-09-22 DIAGNOSIS — H524 Presbyopia: Secondary | ICD-10-CM | POA: Diagnosis not present

## 2019-09-22 DIAGNOSIS — H0012 Chalazion right lower eyelid: Secondary | ICD-10-CM | POA: Diagnosis not present

## 2019-09-22 DIAGNOSIS — H40013 Open angle with borderline findings, low risk, bilateral: Secondary | ICD-10-CM | POA: Diagnosis not present

## 2019-09-22 DIAGNOSIS — H40053 Ocular hypertension, bilateral: Secondary | ICD-10-CM | POA: Diagnosis not present

## 2019-10-14 DIAGNOSIS — M2550 Pain in unspecified joint: Secondary | ICD-10-CM | POA: Diagnosis not present

## 2019-10-14 DIAGNOSIS — I83813 Varicose veins of bilateral lower extremities with pain: Secondary | ICD-10-CM | POA: Diagnosis not present

## 2019-10-14 DIAGNOSIS — Z23 Encounter for immunization: Secondary | ICD-10-CM | POA: Diagnosis not present

## 2019-10-14 DIAGNOSIS — H6121 Impacted cerumen, right ear: Secondary | ICD-10-CM | POA: Diagnosis not present

## 2019-10-14 DIAGNOSIS — Z1211 Encounter for screening for malignant neoplasm of colon: Secondary | ICD-10-CM | POA: Diagnosis not present

## 2019-10-14 DIAGNOSIS — Z0000 Encounter for general adult medical examination without abnormal findings: Secondary | ICD-10-CM | POA: Diagnosis not present

## 2019-10-14 DIAGNOSIS — M810 Age-related osteoporosis without current pathological fracture: Secondary | ICD-10-CM | POA: Diagnosis not present

## 2019-10-25 MED FILL — ALENDRONATE NA 70 MG TAB: 70 | 56 days supply | Qty: 8 | Fill #2

## 2019-12-06 MED FILL — TRIAMTERENE/HCTZ 37.5/25 TB: 37.5-25 | 90 days supply | Qty: 90 | Fill #1

## 2020-01-18 DIAGNOSIS — M810 Age-related osteoporosis without current pathological fracture: Secondary | ICD-10-CM | POA: Diagnosis not present

## 2020-01-18 DIAGNOSIS — M2550 Pain in unspecified joint: Secondary | ICD-10-CM | POA: Diagnosis not present

## 2020-01-18 DIAGNOSIS — I83813 Varicose veins of bilateral lower extremities with pain: Secondary | ICD-10-CM | POA: Diagnosis not present

## 2020-03-21 DIAGNOSIS — M542 Cervicalgia: Secondary | ICD-10-CM | POA: Diagnosis not present

## 2020-03-21 DIAGNOSIS — S161XXA Strain of muscle, fascia and tendon at neck level, initial encounter: Secondary | ICD-10-CM | POA: Diagnosis not present

## 2020-04-13 DIAGNOSIS — H52223 Regular astigmatism, bilateral: Secondary | ICD-10-CM | POA: Diagnosis not present

## 2020-04-13 DIAGNOSIS — H40059 Ocular hypertension, unspecified eye: Secondary | ICD-10-CM | POA: Diagnosis not present

## 2020-04-13 DIAGNOSIS — H5213 Myopia, bilateral: Secondary | ICD-10-CM | POA: Diagnosis not present

## 2020-04-13 DIAGNOSIS — H40013 Open angle with borderline findings, low risk, bilateral: Secondary | ICD-10-CM | POA: Diagnosis not present

## 2020-04-13 DIAGNOSIS — H524 Presbyopia: Secondary | ICD-10-CM | POA: Diagnosis not present

## 2020-04-13 DIAGNOSIS — H40053 Ocular hypertension, bilateral: Secondary | ICD-10-CM | POA: Diagnosis not present

## 2020-04-19 IMAGING — MR MR SHOULDER*R* W/CM
4 of 7 series · 13 of 40 positions shown · IV contrast (agent unspecified)
Comparison: None.

CLINICAL DATA: Right shoulder pain for 1 year. Popping. Right arm
weakness.

EXAM:
MR ARTHROGRAM OF THE RIGHT SHOULDER
TECHNIQUE: Multiplanar, multisequence MR imaging of the right shoulder was
performed following the administration of intra-articular contrast.
CONTRAST:  See Injection Documentation.

[Series 7: T1 fat-sat · axial · right · 3.0mm · 0.36mm/px · z∈[-19,+65]mm · 3 of 25 slices shown (1 of 2)]
[im 1/25]
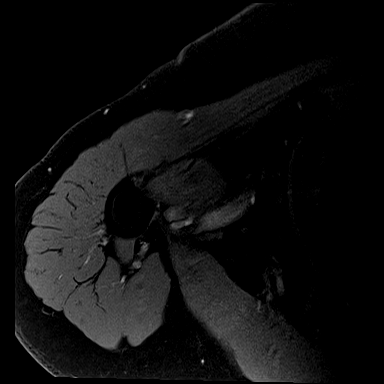
[im 13/25]
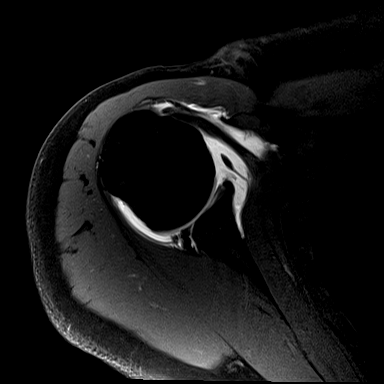
[im 25/25]
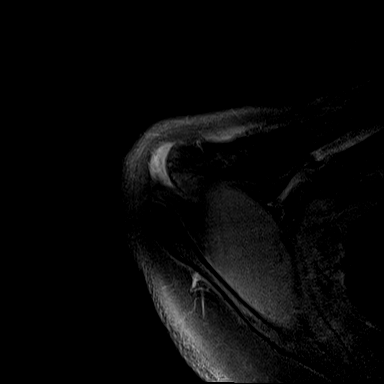

[Series 8: T2 fat-sat · oblique · right · 3.0mm · 0.22mm/px · 4 of 25 slices shown (1 of 2)]
[im 1/25]
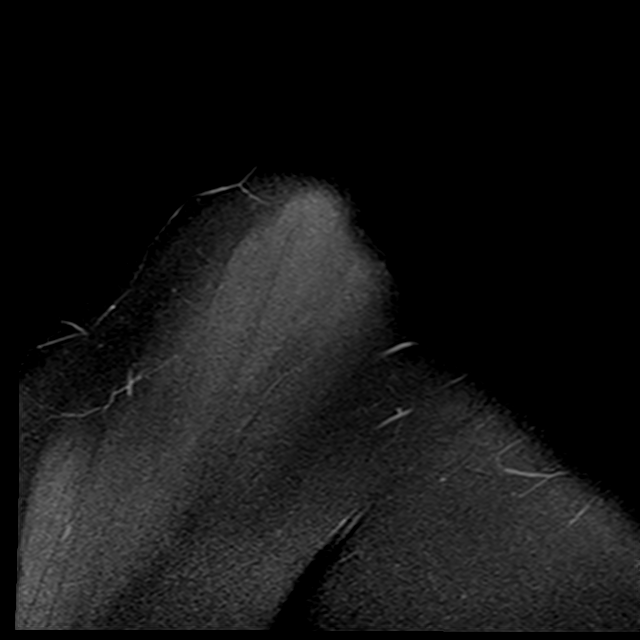
[im 5/25]
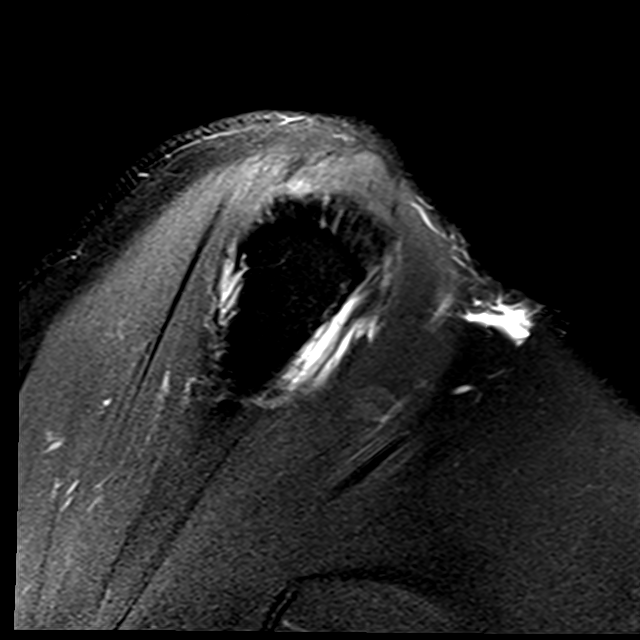
[im 15/25]
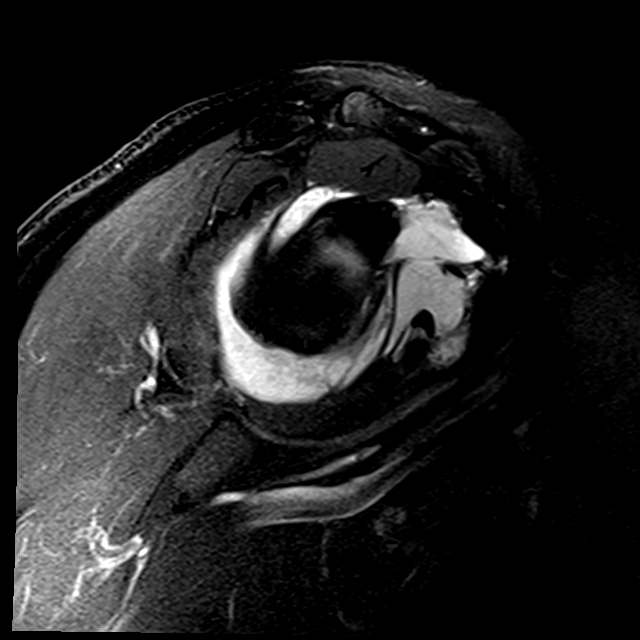
[im 25/25]
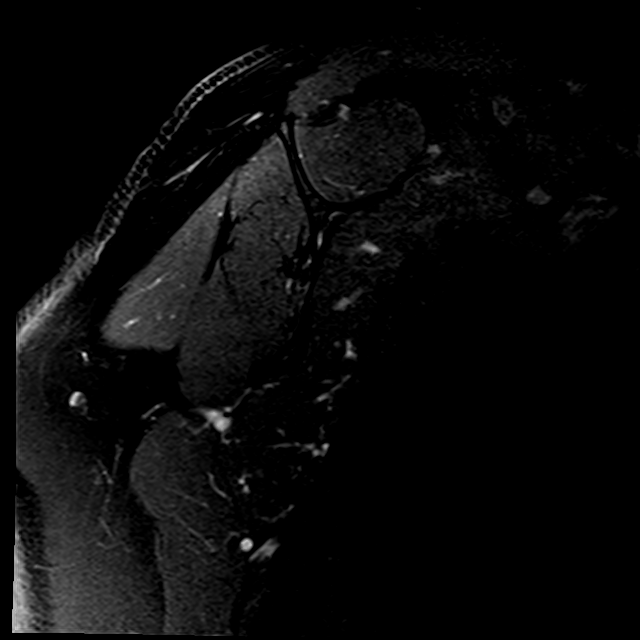

[Series 11: T1 fat-sat · oblique · right · 3.0mm · 0.18mm/px · 3 of 24 slices shown (2 of 2)]
[im 5/24]
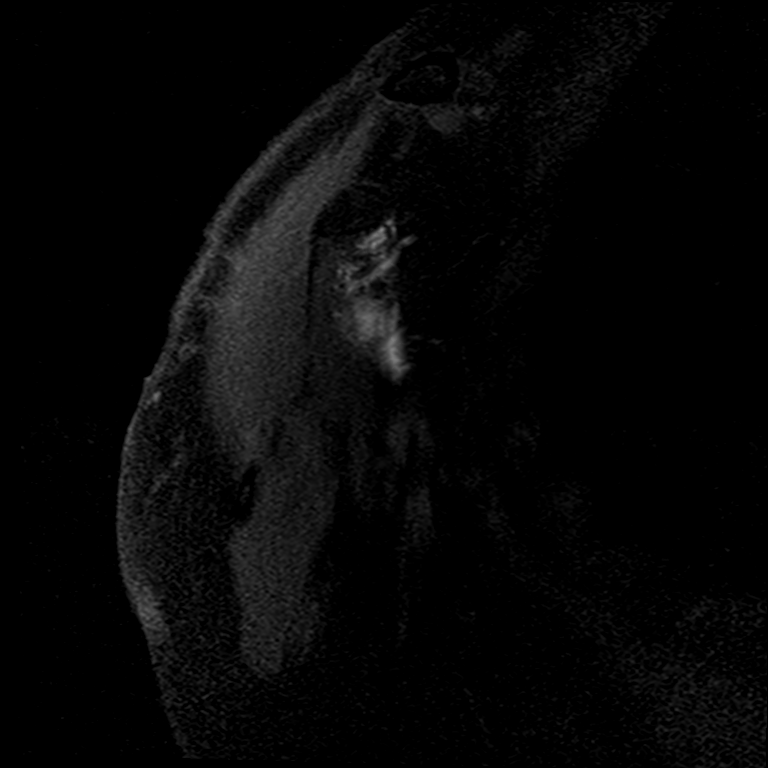
[im 14/24]
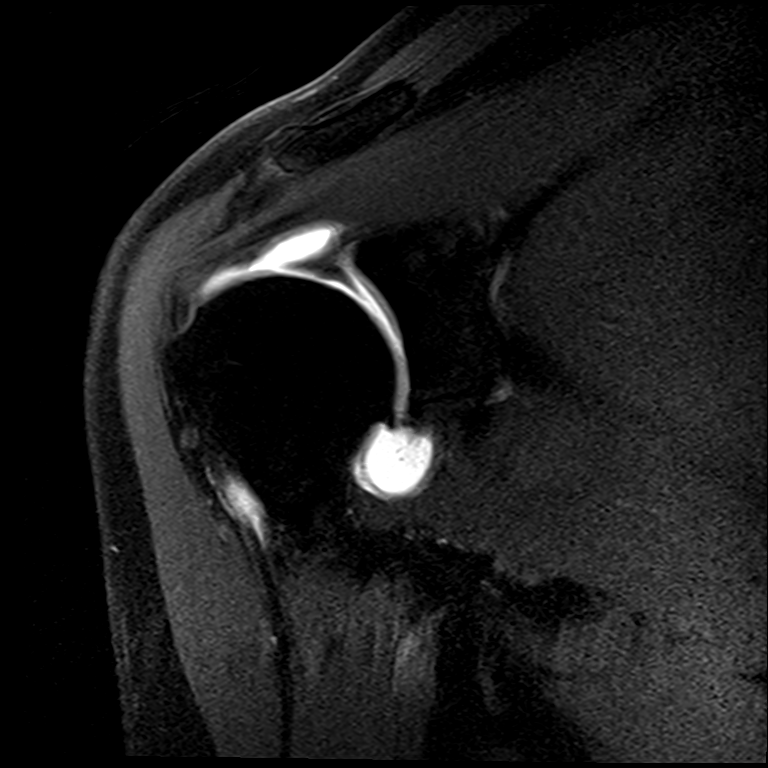
[im 24/24]
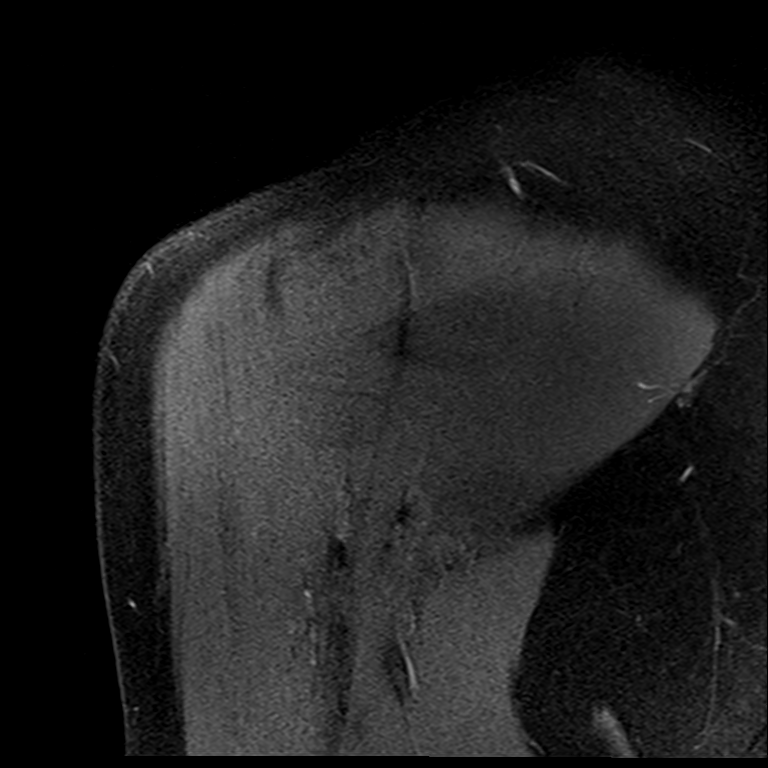

[Series 12: T2 fat-sat · oblique · right · 3.0mm · 0.22mm/px · 3 of 24 slices shown (2 of 2)]
[im 5/24]
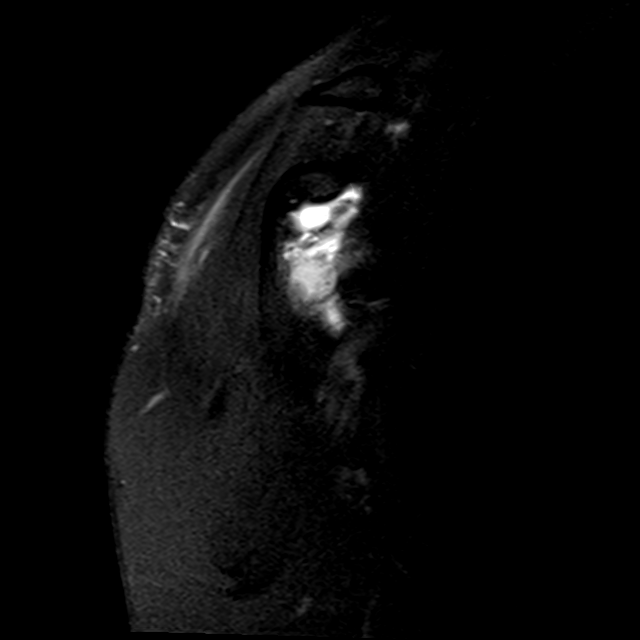
[im 14/24]
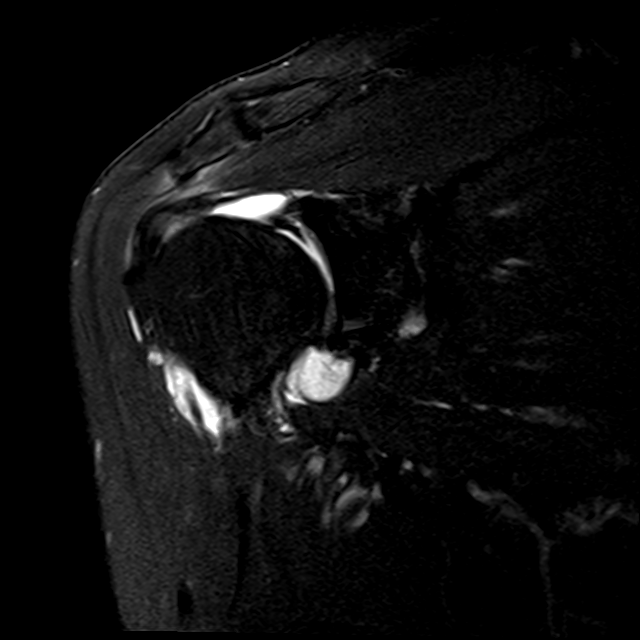
[im 24/24]
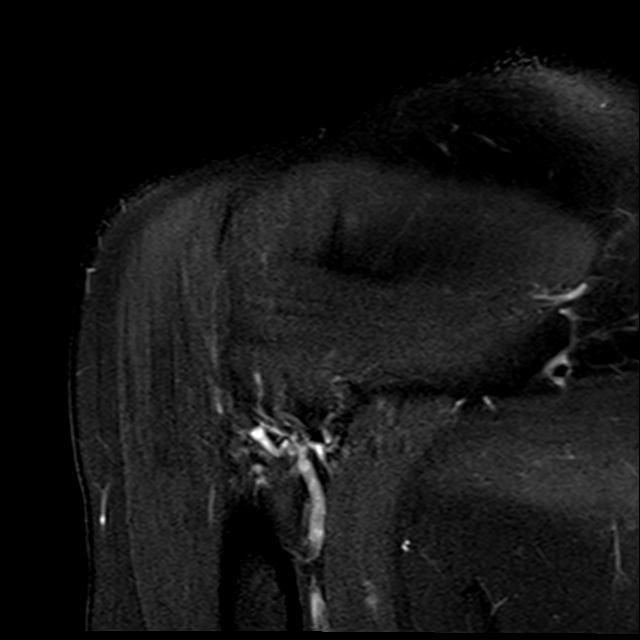

[13 of 40 positions shown; findings below may reference images not displayed]

FINDINGS: Rotator cuff: There is a small intramuscular tear of the
subscapularis at the musculotendinous junction best seen on image 15
of series 8. Degenerative changes of the distal infraspinatus and
supraspinatus tendons without tears. Teres minor tendon is intact.

Muscles: No atrophy or edema.

Biceps long head: Properly located and intact.  No subluxation.

Acromioclavicular Joint: Normal.  Type 1 acromion.  No bursitis.

Glenohumeral Joint: Thinning of the articular cartilage of the
humeral head best seen on image 10 of series 17. Extensive
full-thickness cartilage loss of the posterior aspect of the
glenoid. Deformity of the posterior inferior aspect of the rim of
the glenoid.

Labrum: Extensive SLAP tear with an extensive chronic posterior and
inferior labral tear. The tear extends from approximately 5 o'clock
to 12 o'clock. Chronic deformity of the posterior inferior aspect of
the glenoid adjacent to the tear at that site.

Bones: Small marginal osteophytes on the humeral head.
IMPRESSION: 1. Extensive chronic tear of the labrum involving the inferior,
posterior, and superior labrum. Chronic deformity and rounding of
the posterior inferior aspect of the glenoid secondary to the labral
tear.
2. Small intrasubstance tear of the musculotendinous junction of the
subscapularis tendon.
3. Cartilage loss in the glenohumeral joint with small marginal
osteophytes on the inferior aspect of the humeral head.

## 2020-05-25 ENCOUNTER — Other Ambulatory Visit: Payer: Self-pay

## 2020-05-25 DIAGNOSIS — M24811 Other specific joint derangements of right shoulder, not elsewhere classified: Secondary | ICD-10-CM | POA: Diagnosis not present

## 2020-06-06 MED FILL — TRIAMTERENE-HCTZ 37.5-25 MG: 37.5-25 | 90 days supply | Qty: 90 | Fill #3

## 2020-08-09 DIAGNOSIS — Z23 Encounter for immunization: Secondary | ICD-10-CM | POA: Diagnosis not present

## 2020-08-25 DIAGNOSIS — Z1231 Encounter for screening mammogram for malignant neoplasm of breast: Secondary | ICD-10-CM | POA: Diagnosis not present

## 2020-09-05 MED FILL — TRIAMTERENE-HCTZ 37.5-25 MG: 37.5-25 | 90 days supply | Qty: 90 | Fill #0

## 2020-09-13 DIAGNOSIS — R928 Other abnormal and inconclusive findings on diagnostic imaging of breast: Secondary | ICD-10-CM | POA: Diagnosis not present

## 2020-09-29 DIAGNOSIS — H524 Presbyopia: Secondary | ICD-10-CM | POA: Diagnosis not present

## 2020-09-29 DIAGNOSIS — H40052 Ocular hypertension, left eye: Secondary | ICD-10-CM | POA: Diagnosis not present

## 2020-09-29 DIAGNOSIS — H5213 Myopia, bilateral: Secondary | ICD-10-CM | POA: Diagnosis not present

## 2020-09-29 DIAGNOSIS — H52223 Regular astigmatism, bilateral: Secondary | ICD-10-CM | POA: Diagnosis not present

## 2020-10-16 DIAGNOSIS — Z0000 Encounter for general adult medical examination without abnormal findings: Secondary | ICD-10-CM | POA: Diagnosis not present

## 2020-10-16 DIAGNOSIS — M810 Age-related osteoporosis without current pathological fracture: Secondary | ICD-10-CM | POA: Diagnosis not present

## 2020-10-19 DIAGNOSIS — Z0000 Encounter for general adult medical examination without abnormal findings: Secondary | ICD-10-CM | POA: Diagnosis not present

## 2020-10-19 DIAGNOSIS — M810 Age-related osteoporosis without current pathological fracture: Secondary | ICD-10-CM | POA: Diagnosis not present

## 2020-10-19 DIAGNOSIS — I83813 Varicose veins of bilateral lower extremities with pain: Secondary | ICD-10-CM | POA: Diagnosis not present

## 2020-10-19 DIAGNOSIS — R5383 Other fatigue: Secondary | ICD-10-CM | POA: Diagnosis not present

## 2020-10-19 DIAGNOSIS — R030 Elevated blood-pressure reading, without diagnosis of hypertension: Secondary | ICD-10-CM | POA: Diagnosis not present

## 2020-10-19 DIAGNOSIS — Z8249 Family history of ischemic heart disease and other diseases of the circulatory system: Secondary | ICD-10-CM | POA: Diagnosis not present

## 2020-11-02 MED FILL — OLMESARTAN-HCTZ 40-25 MG TA: 40-25 | 90 days supply | Qty: 90 | Fill #0

## 2020-11-08 ENCOUNTER — Ambulatory Visit (INDEPENDENT_AMBULATORY_CARE_PROVIDER_SITE_OTHER): Payer: PPO

## 2020-11-08 ENCOUNTER — Encounter: Payer: Self-pay | Admitting: Orthopaedic Surgery

## 2020-11-08 ENCOUNTER — Ambulatory Visit (INDEPENDENT_AMBULATORY_CARE_PROVIDER_SITE_OTHER): Payer: PPO | Admitting: Orthopaedic Surgery

## 2020-11-08 VITALS — BP 152/72 | HR 90 | Ht 62.0 in | Wt 125.0 lb

## 2020-11-08 DIAGNOSIS — G8929 Other chronic pain: Secondary | ICD-10-CM

## 2020-11-08 DIAGNOSIS — M25562 Pain in left knee: Secondary | ICD-10-CM

## 2020-11-08 NOTE — Progress Notes (Signed)
Office Visit Note   Patient: Rebecca Davis           Date of Birth: Nov 01, 1955           MRN: [id number] Visit Date: 11/08/2020              Requested by: Deland Pretty, MD [address] [place],  [place] [address] PCP: Deland Pretty, MD   Assessment & Plan: Visit Diagnoses:  1. Chronic pain of left knee     Plan: Reviewed x-rays with her compared them to images including CT scan preop and postop images 12 years ago.  She has not developed any significant radiographic evidence of arthritis.  No locking since her incident taking down the Christmas tree.  She could use some Advil short-term intermittent ice.  She has tried support stockings in the past but states that they did not really help her legs and she can look online for some that are not as tight and may be easier for her to apply but still give her some support.  If she has locking symptoms or recurrent knee swelling she can return.  Follow-Up Instructions: No follow-ups on file.   Orders:  Orders Placed This Encounter  Procedures  . XR Knee 1-2 Views Left   No orders of the defined types were placed in this encounter.     Procedures: No procedures performed   Clinical Data: No additional findings.   Subjective: Chief Complaint  Patient presents with  . 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  She underwent left tibial plateau ORIF 10 years ago by me.  Patient states that when she turned and twist she has had some increased discomfort particularly when she was having to do some lifting and was taken down a Christmas tree 12 days ago.  She states she could barely get up on her leg had problems walking and use crutches but since that time its been improved and she can walk now without significant limping.  She noticed some prominence  laterally over the plate where she had discomfort.  She denies locking.  She is using Advil but only took 1 and states she really does not like to take medication.  She denies associated back pain no chills or fever.  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  Review of Systems positive for tibial plateau lateral ORIF 09/23/2011.  Positive for osteopenia treated with calcium vitamin D and Fosamax.  Positive hypertension controlled with medication.   Objective: Vital Signs: BP (!) 152/72   Pulse 90   Ht 5\' 2"  (1.575 m)   Wt 125 lb (56.7 kg)   BMI 22.86 kg/m   Physical Exam Constitutional:      Appearance: She is well-developed.  HENT:     Head: Normocephalic.     Right Ear: External ear normal.     Left Ear: External ear normal.  Eyes:     Pupils: Pupils are equal, round, and reactive to light.  Neck:     Thyroid: No thyromegaly.     Trachea: No tracheal deviation.  Cardiovascular:     Rate and Rhythm: Normal rate.  Pulmonary:     Effort: Pulmonary effort is normal.  Abdominal:     Palpations: Abdomen  is soft.  Skin:    General: Skin is warm and dry.  Neurological:     Mental Status: She is alert and oriented to person, place, and time.  Psychiatric:        Mood and Affect: Mood and affect normal.        Behavior: Behavior normal.     Ortho Exam patient has mild tenderness over the anterior aspect of the plate tibial plateau.  No knee effusion.  She has full extension flexes past 120 degrees is amatory without a knee limp.  Gertie's tubercle is normal iliotibial band is normal.  No lower extremity edema.  Specialty Comments:  No specialty comments available.  Imaging: XR Knee 1-2 Views Left  Result Date: 11/08/2020 Standing AP both knees lateral left knee obtained and reviewed.  This shows previous lateral plate and screw fixation without joint space narrowing, osteophytes or subchondral sclerosis or cyst formation.  Comparison  2012 images. Impression: Post-ORIF left tibial plateau with images unchanged from postop images 2012.    PMFS History: Patient Active Problem List   Diagnosis Date Noted  . OSTEOPENIA 07/14/2008   Past Medical History:  Diagnosis Date  . PONV (postoperative nausea and vomiting)     Family History  Problem Relation Age of Onset  . Anesthesia problems Mother     Past Surgical History:  Procedure Laterality Date  . CARPAL TUNNEL RELEASE  2008   right  . ORIF TIBIA PLATEAU  09/23/2011   Procedure: OPEN REDUCTION INTERNAL FIXATION (ORIF) TIBIAL PLATEAU;  Surgeon: Marybelle Killings;  Location: [facility];  Service: Orthopedics;  Laterality: Left;  Marland Kitchen VAGINAL HYSTERECTOMY     partial   Social History   Occupational History  . Not on file  Tobacco Use  . Smoking status: Never Smoker  . Smokeless tobacco: Never Used  Substance and Sexual Activity  . Alcohol use: Not on file    Comment: rarely  . Drug use: No  . Sexual activity: Not on file    Comment: hysterectomy-partial

## 2020-11-21 DIAGNOSIS — I10 Essential (primary) hypertension: Secondary | ICD-10-CM | POA: Diagnosis not present

## 2020-11-21 DIAGNOSIS — R7301 Impaired fasting glucose: Secondary | ICD-10-CM | POA: Diagnosis not present

## 2020-11-21 DIAGNOSIS — E875 Hyperkalemia: Secondary | ICD-10-CM | POA: Diagnosis not present

## 2020-11-28 ENCOUNTER — Other Ambulatory Visit (HOSPITAL_COMMUNITY): Payer: Self-pay | Admitting: Internal Medicine

## 2020-11-28 DIAGNOSIS — I2510 Atherosclerotic heart disease of native coronary artery without angina pectoris: Secondary | ICD-10-CM | POA: Diagnosis not present

## 2020-11-28 DIAGNOSIS — E875 Hyperkalemia: Secondary | ICD-10-CM | POA: Diagnosis not present

## 2020-11-28 DIAGNOSIS — I2584 Coronary atherosclerosis due to calcified coronary lesion: Secondary | ICD-10-CM | POA: Diagnosis not present

## 2020-11-28 DIAGNOSIS — I10 Essential (primary) hypertension: Secondary | ICD-10-CM | POA: Diagnosis not present

## 2020-11-28 MED FILL — OLMESARTAN MEDOXOMIL 20 MG: 20 | 30 days supply | Qty: 30 | Fill #0

## 2020-11-28 MED FILL — ROSUVASTATIN CALCIUM 20 MG: 20 | 90 days supply | Qty: 90 | Fill #0

## 2020-12-28 ENCOUNTER — Other Ambulatory Visit (HOSPITAL_COMMUNITY): Payer: Self-pay | Admitting: Internal Medicine

## 2020-12-28 MED FILL — OLMESARTAN MEDOXOMIL 20 MG: 20 | 30 days supply | Qty: 30 | Fill #0

## 2021-01-03 DIAGNOSIS — M810 Age-related osteoporosis without current pathological fracture: Secondary | ICD-10-CM | POA: Diagnosis not present

## 2021-01-04 ENCOUNTER — Other Ambulatory Visit (HOSPITAL_COMMUNITY): Payer: Self-pay | Admitting: Internal Medicine

## 2021-01-04 DIAGNOSIS — I10 Essential (primary) hypertension: Secondary | ICD-10-CM | POA: Diagnosis not present

## 2021-01-04 DIAGNOSIS — H5213 Myopia, bilateral: Secondary | ICD-10-CM | POA: Diagnosis not present

## 2021-01-04 DIAGNOSIS — H40012 Open angle with borderline findings, low risk, left eye: Secondary | ICD-10-CM | POA: Diagnosis not present

## 2021-01-04 DIAGNOSIS — E875 Hyperkalemia: Secondary | ICD-10-CM | POA: Diagnosis not present

## 2021-01-04 DIAGNOSIS — I2584 Coronary atherosclerosis due to calcified coronary lesion: Secondary | ICD-10-CM | POA: Diagnosis not present

## 2021-01-04 DIAGNOSIS — H52223 Regular astigmatism, bilateral: Secondary | ICD-10-CM | POA: Diagnosis not present

## 2021-01-04 DIAGNOSIS — H40052 Ocular hypertension, left eye: Secondary | ICD-10-CM | POA: Diagnosis not present

## 2021-01-04 DIAGNOSIS — H524 Presbyopia: Secondary | ICD-10-CM | POA: Diagnosis not present

## 2021-01-04 MED FILL — AMLODIPINE 2.5 MG TABLET: 2.5 | 30 days supply | Qty: 30 | Fill #0

## 2021-01-29 ENCOUNTER — Other Ambulatory Visit (HOSPITAL_COMMUNITY): Payer: Self-pay

## 2021-01-29 MED FILL — Amlodipine Besylate Tab 2.5 MG (Base Equivalent): ORAL | 30 days supply | Qty: 30 | Fill #0 | Status: AC

## 2021-01-30 ENCOUNTER — Other Ambulatory Visit (HOSPITAL_COMMUNITY): Payer: Self-pay

## 2021-01-30 MED FILL — Olmesartan Medoxomil Tab 20 MG: ORAL | 30 days supply | Qty: 30 | Fill #0 | Status: AC

## 2021-02-16 ENCOUNTER — Ambulatory Visit (INDEPENDENT_AMBULATORY_CARE_PROVIDER_SITE_OTHER): Payer: PPO | Admitting: Cardiovascular Disease

## 2021-02-16 ENCOUNTER — Encounter: Payer: Self-pay | Admitting: Cardiovascular Disease

## 2021-02-16 ENCOUNTER — Other Ambulatory Visit: Payer: Self-pay

## 2021-02-16 VITALS — BP 140/71 | HR 67 | Ht 62.0 in | Wt 127.8 lb

## 2021-02-16 DIAGNOSIS — E7800 Pure hypercholesterolemia, unspecified: Secondary | ICD-10-CM

## 2021-02-16 DIAGNOSIS — I351 Nonrheumatic aortic (valve) insufficiency: Secondary | ICD-10-CM | POA: Diagnosis not present

## 2021-02-16 DIAGNOSIS — R931 Abnormal findings on diagnostic imaging of heart and coronary circulation: Secondary | ICD-10-CM

## 2021-02-16 DIAGNOSIS — R0989 Other specified symptoms and signs involving the circulatory and respiratory systems: Secondary | ICD-10-CM | POA: Diagnosis not present

## 2021-02-16 DIAGNOSIS — I10 Essential (primary) hypertension: Secondary | ICD-10-CM

## 2021-02-16 NOTE — Patient Instructions (Signed)
Medication Instructions:  Your physician recommends that you continue on your current medications as directed. Please refer to the Current Medication list given to you today.  *If you need a refill on your cardiac medications before your next appointment, please call your pharmacy*   Lab Work: None ordered  If you have labs (blood work) drawn today and your tests are completely normal, you will receive your results only by: Marland Kitchen MyChart Message (if you have MyChart) OR . A paper copy in the mail If you have any lab test that is abnormal or we need to change your treatment, we will call you to review the results.   Testing/Procedures: Your physician has requested that you have an echocardiogram. Echocardiography is a painless test that uses sound waves to create images of your heart. It provides your doctor with information about the size and shape of your heart and how well your heart's chambers and valves are working. This procedure takes approximately one hour. There are no restrictions for this procedure.  Your physician has requested that you have an exercise tolerance test. For further information please visit [email or web address]. Please also follow instruction sheet, as given.     Follow-Up: At [facility], you and your health needs are our priority.  As part of our continuing mission to provide you with exceptional heart care, we have created designated Provider Care Teams.  These Care Teams include your primary Cardiologist (physician) and Advanced Practice Providers (APPs -  Physician Assistants and Nurse Practitioners) who all work together to provide you with the care you need, when you need it.  We recommend signing up for the patient portal called "MyChart".  Sign up information is provided on this After Visit Summary.  MyChart is used to connect with patients for Virtual Visits (Telemedicine).  Patients are able to view lab/test results, encounter notes, upcoming appointments,  etc.  Non-urgent messages can be sent to your provider as well.   To learn more about what you can do with MyChart, go to [email or web address].    Your next appointment:   12 month(s)  The format for your next appointment:   In Person  Provider:   Sanda Klein, MD

## 2021-02-17 ENCOUNTER — Encounter: Payer: Self-pay | Admitting: Cardiovascular Disease

## 2021-02-17 DIAGNOSIS — I10 Essential (primary) hypertension: Secondary | ICD-10-CM | POA: Insufficient documentation

## 2021-02-17 DIAGNOSIS — R0989 Other specified symptoms and signs involving the circulatory and respiratory systems: Secondary | ICD-10-CM | POA: Insufficient documentation

## 2021-02-17 DIAGNOSIS — E7800 Pure hypercholesterolemia, unspecified: Secondary | ICD-10-CM | POA: Insufficient documentation

## 2021-02-17 DIAGNOSIS — R931 Abnormal findings on diagnostic imaging of heart and coronary circulation: Secondary | ICD-10-CM | POA: Insufficient documentation

## 2021-02-17 NOTE — Progress Notes (Signed)
Cardiology Office Note:    Date:  02/17/2021   ID:  JOETTA Davis, DOB 10-Aug-1956, MRN [id number]  PCP:  Deland Pretty, [place]  Cardiologist:  No primary care provider on file. NEW Advanced Practice Provider:  No care team member to display Electrophysiologist:  None       Referring MD: Deland Pretty, MD   Chief Complaint  Patient presents with  . Hypertension  . Hyperlipidemia  . abnormal coronary calcium score    History of Present Illness:    Rebecca Davis is a 65 y.o. female with a hx of familial hypercholesterolemia, recent onset systemic hypertension, peripheral venous insufficiency, seeking advice for management of her hypertension and dyslipidemia, and to discuss the implications of her elevated coronary calcium score.  She feels that "all her problems started in the last 2 years".  She works in Production designer, theatre/television/film at [employer] and feels that the workplace environment has not been beneficial to her health.  She is looking forward to working on a reduced schedule in the near future.  She has been keeping a fairly close telemetry on her blood pressure, checked both in the morning and in the afternoon.  Her morning blood pressure readings are excellent but in the afternoon she frequently has systolic blood pressures in the 150s and 160s.  Interestingly, her diastolic blood pressure is often low, in the 50s and almost never exceeds 70 mmHg, even when her systolic blood pressure is high.  She did very poorly when she was prescribed diuretics for management of her hypertension.  This was the case when she took either olmesartan-hydrochlorothiazide or when she took [place].  She felt extreme fatigue and dizziness.  She is doing much better on a combination of amlodipine and olmesartan.  She takes olmesartan in the morning and amlodipine at night.  She has not had problems with lower extremity edema.  She is carefully  compliant with all her medications.  She stopped taking rosuvastatin due to complaints of muscle aches and pains everywhere.  Her most recent lipid profile showed a total cholesterol 222, LDL 137, HDL 66.  Her hemoglobin A1c was borderline at 5.8%.  She has normal renal function.  Coronary calcium score performed at [facility] on 11/09/2020 was mildly elevated at 52 (50-70 5th percentile for age and gender) interestingly virtually all the calcium was concentrated in her right coronary artery.  She denies angina at rest or with activity, lower extremity edema, claudication, focal neurological complaints, orthopnea or PND.  He does not have a history of falls or frequent injuries or bleeding.  Her brother Rebecca Davis is also my patient and has extensive coronary atherosclerosis without significant stenosis, hypertension and hypercholesterolemia.  Past Medical History:  Diagnosis Date  . PONV (postoperative nausea and vomiting)     Past Surgical History:  Procedure Laterality Date  . CARPAL TUNNEL RELEASE  2008   right  . ORIF TIBIA PLATEAU  09/23/2011   Procedure: OPEN REDUCTION INTERNAL FIXATION (ORIF) TIBIAL PLATEAU;  Surgeon: Marybelle Killings;  Location: [place];  Service: Orthopedics;  Laterality: Left;  Marland Kitchen VAGINAL HYSTERECTOMY     partial    Current Medications: Current Meds  Medication Sig  . aspirin 81 MG chewable tablet 1 tablet  . Cholecalciferol (VITAMIN D3) 100000 UNIT/GM POWD 1 capsule  . olmesartan (BENICAR) 20 MG tablet TAKE 1 TABLET BY MOUTH ONCE DAILY  . [DISCONTINUED] alendronate (FOSAMAX) 70 MG tablet   . [  DISCONTINUED] amLODipine (NORVASC) 2.5 MG tablet TAKE 1 TABLET BY MOUTH ONCE DAILY  . [DISCONTINUED] olmesartan (BENICAR) 20 MG tablet TAKE 1 TABLET BY MOUTH ONCE A DAY ( REPLACES OLMESARTAN/HCTZ)  . [DISCONTINUED] olmesartan (BENICAR) 20 MG tablet TAKE 1 TABLET BY MOUTH ONCE DAILY (REPLACES OLMESARTAN/HCTZ)  . [DISCONTINUED] olmesartan-hydrochlorothiazide (BENICAR HCT)  40-25 MG tablet Take 1 tablet by mouth daily.  . [DISCONTINUED] Phenyleph-CPM-DM-Aspirin (ALKA-SELTZER PLUS COLD & COUGH PO) Take 1 tablet by mouth as needed.  . [DISCONTINUED] rosuvastatin (CRESTOR) 20 MG tablet TAKE 1 TABLET BY MOUTH ONCE DAILY  . [DISCONTINUED] triamterene-hydrochlorothiazide (MAXZIDE-25) 37.5-25 MG tablet Take 1 tablet by mouth every morning.     Allergies:   Codeine and Other   Social History   Socioeconomic History  . Marital status: Divorced    Spouse name: Not on file  . Number of children: Not on file  . Years of education: Not on file  . Highest education level: Not on file  Occupational History  . Not on file  Tobacco Use  . Smoking status: Never Smoker  . Smokeless tobacco: Never Used  Substance and Sexual Activity  . Alcohol use: Not on file    Comment: rarely  . Drug use: No  . Sexual activity: Not on file    Comment: hysterectomy-partial  Other Topics Concern  . Not on file  Social History Narrative  . Not on file   Social Determinants of Health   Financial Resource Strain: Not on file  Food Insecurity: Not on file  Transportation Needs: Not on file  Physical Activity: Not on file  Stress: Not on file  Social Connections: Not on file     Family History: The patient's family history includes Anesthesia problems in her mother.  Her brother has hypertension, hypercholesterolemia and moderate coronary atherosclerosis without stenoses  ROS:   Please see the history of present illness.     All other systems reviewed and are negative.  EKGs/Labs/Other Studies Reviewed:    The following studies were reviewed today: Coronary calcium score, labs  EKG:  EKG is ordered today.  The ekg ordered today demonstrates normal sinus rhythm, normal tracing, QTC 422 ms  Recent Labs: No results found for requested labs within last 8760 hours.  Recent Lipid Panel    Component Value Date/Time   CHOL 183 03/01/2013 0840   TRIG 46.0 03/01/2013 0840    HDL 59.60 03/01/2013 0840   CHOLHDL 3 03/01/2013 0840   VLDL 9.2 03/01/2013 0840   LDLCALC 114 (H) 03/01/2013 0840   LDLDIRECT 116.5 07/14/2008 1109    10/16/2020 Cholesterol 222, LDL 137, HDL 66, triglycerides 106  hemoglobin A1c 5.8%, creatinine 0.72, TSH 1.57  Risk Assessment/Calculations:       Physical Exam:    VS:  BP 140/71   Pulse 67   Ht 5\' 2"  (1.575 m)   Wt 127 lb 12.8 oz (58 kg)   SpO2 99%   BMI 23.37 kg/m     Wt Readings from Last 3 Encounters:  02/16/21 127 lb 12.8 oz (58 kg)  11/08/20 125 lb (56.7 kg)  09/13/13 125 lb (56.7 kg)     GEN: Appears well, younger than stated age, well nourished, well developed in no acute distress HEENT: Normal NECK: No JVD; No carotid bruits LYMPHATICS: No lymphadenopathy CARDIAC: RRR, no murmurs, rubs, gallops RESPIRATORY:  Clear to auscultation without rales, wheezing or rhonchi  ABDOMEN: Soft, non-tender, non-distended MUSCULOSKELETAL:  No edema; No deformity  SKIN: Warm and dry NEUROLOGIC:  Alert and oriented x 3 PSYCHIATRIC:  Normal affect   ASSESSMENT:    1. Elevated coronary artery calcium score   2. Essential hypertension   3. Widened pulse pressure   4. Hypercholesterolemia   5. Aortic valve insufficiency, etiology of cardiac valve disease unspecified    PLAN:    In order of problems listed above:  1. Abnormal calcium score: Mildly elevated, low risk for significant coronary stenoses or for adverse events in the near future, need to address risk factors to prevent progression of disease.  A treadmill stress test is reasonable at this time. 2. HTN: On the current medical regimen she has systolic hypertension in the evenings, but her diastolic blood pressure is normal or frequently low.  Suggested that she take both the amlodipineand the olmesartan in the morning, to see if we can even out her afternoon blood pressure distribution.  It is interesting to note that her brother Rebecca Davis also felt poorly when  prescribed diuretics. 3. Wide pulse pressure: The wide pulse pressure may be a sign of decreased vascular compliance, but also raises concern for aortic insufficiency.  I think she should undergo an echocardiogram, although I really cannot hear murmur of aortic insufficiency on exam today.  Her pulses are quite hyperdynamic. 4. HLP: I encouraged her to restart taking rosuvastatin as the best way to prevent progression of disease.  Ideally, achieve a target LDL less than 70 based on the elevated calcium score, but a minimum threshold of 100.  Also notes that her most recent LDL cholesterol level was substantially higher than what appears to be her previous baseline in 2014 and 2007 when her LDL was around 115.  Reduced physical exercise with her current job may be part of the problem.  She plans to improve her level of exercise, eat a healthy diet and recheck labs with Joseph Berkshire at [place].   Shared Decision Making/Informed Consent The risks [chest pain, shortness of breath, cardiac arrhythmias, dizziness, blood pressure fluctuations, myocardial infarction, stroke/transient ischemic attack, and life-threatening complications (estimated to be 1 in 10,000)], benefits (risk stratification, diagnosing coronary artery disease, treatment guidance) and alternatives of an exercise tolerance test were discussed in detail with Ms. Mccalip and she agrees to proceed.      Medication Adjustments/Labs and Tests Ordered: Current medicines are reviewed at length with the patient today.  Concerns regarding medicines are outlined above.  Orders Placed This Encounter  Procedures  . EXERCISE TOLERANCE TEST (ETT)  . EKG 12-Lead  . ECHOCARDIOGRAM COMPLETE   No orders of the defined types were placed in this encounter.   Patient Instructions  Medication Instructions:  Your physician recommends that you continue on your current medications as directed. Please refer to the Current Medication list given  to you today.  *If you need a refill on your cardiac medications before your next appointment, please call your pharmacy*   Lab Work: None ordered  If you have labs (blood work) drawn today and your tests are completely normal, you will receive your results only by: Marland Kitchen MyChart Message (if you have MyChart) OR . A paper copy in the mail If you have any lab test that is abnormal or we need to change your treatment, we will call you to review the results.   Testing/Procedures: Your physician has requested that you have an echocardiogram. Echocardiography is a painless test that uses sound waves to create images of your heart. It provides your doctor with information about the size and shape  of your heart and how well your heart's chambers and valves are working. This procedure takes approximately one hour. There are no restrictions for this procedure.  Your physician has requested that you have an exercise tolerance test. For further information please visit [email or web address]. Please also follow instruction sheet, as given.     Follow-Up: At [facility], you and your health needs are our priority.  As part of our continuing mission to provide you with exceptional heart care, we have created designated Provider Care Teams.  These Care Teams include your primary Cardiologist (physician) and Advanced Practice Providers (APPs -  Physician Assistants and Nurse Practitioners) who all work together to provide you with the care you need, when you need it.  We recommend signing up for the patient portal called "MyChart".  Sign up information is provided on this After Visit Summary.  MyChart is used to connect with patients for Virtual Visits (Telemedicine).  Patients are able to view lab/test results, encounter notes, upcoming appointments, etc.  Non-urgent messages can be sent to your provider as well.   To learn more about what you can do with MyChart, go to [email or web address].    Your  next appointment:   12 month(s)  The format for your next appointment:   In Person  Provider:   Sanda Klein, MD        Signed, Sanda Klein, MD  02/17/2021 4:56 PM    [place]

## 2021-02-26 ENCOUNTER — Other Ambulatory Visit (HOSPITAL_COMMUNITY): Payer: Self-pay | Admitting: Internal Medicine

## 2021-02-26 ENCOUNTER — Other Ambulatory Visit (HOSPITAL_COMMUNITY): Payer: Self-pay

## 2021-02-26 ENCOUNTER — Telehealth: Payer: Self-pay | Admitting: Cardiovascular Disease

## 2021-02-26 MED FILL — Olmesartan Medoxomil Tab 20 MG: ORAL | 30 days supply | Qty: 30 | Fill #0 | Status: AC

## 2021-02-26 NOTE — Telephone Encounter (Signed)
Called patient to get her ECHO and ETT rescheduled from 5/25. Was calling as ETT was scheduled incorrectly. Patient said she had taken off that date and wanted to know how urgent the need for this appt was. Could not work with the patient's schedule, wanted to keep ECHO and cancel ETT for now. Please call with further info about if ETT is urgent.

## 2021-02-27 ENCOUNTER — Other Ambulatory Visit (HOSPITAL_COMMUNITY): Payer: Self-pay

## 2021-02-27 NOTE — Telephone Encounter (Signed)
Left message on the patient's voicemail that the GXT was not urgent. Scheduling will be calling to reschedule.

## 2021-02-27 NOTE — Telephone Encounter (Signed)
Not urgent

## 2021-02-28 ENCOUNTER — Other Ambulatory Visit (HOSPITAL_COMMUNITY): Payer: Self-pay

## 2021-02-28 MED ORDER — AMLODIPINE BESYLATE 2.5 MG PO TABS
2.5000 mg | ORAL_TABLET | Freq: Every day | ORAL | 2 refills | Status: AC
  Filled 2021-02-28: qty 30, 30d supply, fill #0
  Filled 2021-03-28: qty 30, 30d supply, fill #1
  Filled 2021-04-27: qty 30, 30d supply, fill #2

## 2021-02-28 MED ORDER — ROSUVASTATIN CALCIUM 20 MG PO TABS
20.0000 mg | ORAL_TABLET | Freq: Every day | ORAL | 3 refills | Status: DC
  Filled 2021-02-28: qty 90, 90d supply, fill #0
  Filled 2021-05-28: qty 90, 90d supply, fill #1
  Filled 2021-08-27: qty 90, 90d supply, fill #2
  Filled 2021-11-27: qty 90, 90d supply, fill #3

## 2021-03-19 DIAGNOSIS — R928 Other abnormal and inconclusive findings on diagnostic imaging of breast: Secondary | ICD-10-CM | POA: Diagnosis not present

## 2021-03-20 ENCOUNTER — Other Ambulatory Visit (HOSPITAL_COMMUNITY): Payer: PPO

## 2021-03-21 ENCOUNTER — Ambulatory Visit (HOSPITAL_COMMUNITY): Payer: PPO | Attending: Cardiology

## 2021-03-21 ENCOUNTER — Encounter (HOSPITAL_COMMUNITY): Payer: PPO

## 2021-03-21 ENCOUNTER — Other Ambulatory Visit: Payer: Self-pay

## 2021-03-21 DIAGNOSIS — I351 Nonrheumatic aortic (valve) insufficiency: Secondary | ICD-10-CM | POA: Diagnosis not present

## 2021-03-21 LAB — ECHOCARDIOGRAM COMPLETE
Area-P 1/2: 3.85 cm2
S' Lateral: 3 cm

## 2021-03-28 MED FILL — Olmesartan Medoxomil Tab 20 MG: ORAL | 30 days supply | Qty: 30 | Fill #1 | Status: AC

## 2021-03-29 ENCOUNTER — Other Ambulatory Visit (HOSPITAL_COMMUNITY): Payer: Self-pay

## 2021-04-06 DIAGNOSIS — I10 Essential (primary) hypertension: Secondary | ICD-10-CM | POA: Diagnosis not present

## 2021-04-06 DIAGNOSIS — I2584 Coronary atherosclerosis due to calcified coronary lesion: Secondary | ICD-10-CM | POA: Diagnosis not present

## 2021-04-19 ENCOUNTER — Other Ambulatory Visit (HOSPITAL_COMMUNITY): Payer: Self-pay

## 2021-04-19 DIAGNOSIS — I2584 Coronary atherosclerosis due to calcified coronary lesion: Secondary | ICD-10-CM | POA: Diagnosis not present

## 2021-04-19 DIAGNOSIS — E875 Hyperkalemia: Secondary | ICD-10-CM | POA: Diagnosis not present

## 2021-04-19 DIAGNOSIS — I10 Essential (primary) hypertension: Secondary | ICD-10-CM | POA: Diagnosis not present

## 2021-04-19 MED ORDER — AMLODIPINE BESYLATE 2.5 MG PO TABS
2.5000 mg | ORAL_TABLET | Freq: Every day | ORAL | 3 refills | Status: DC
  Filled 2021-04-19 – 2021-05-28 (×2): qty 90, 90d supply, fill #0
  Filled 2021-08-27: qty 90, 90d supply, fill #1
  Filled 2021-11-27: qty 90, 90d supply, fill #2
  Filled 2022-02-25: qty 90, 90d supply, fill #3

## 2021-04-19 MED ORDER — OLMESARTAN MEDOXOMIL 20 MG PO TABS
20.0000 mg | ORAL_TABLET | Freq: Every day | ORAL | 3 refills | Status: DC
  Filled 2021-04-19 – 2021-05-28 (×2): qty 90, 90d supply, fill #0
  Filled 2021-08-27: qty 90, 90d supply, fill #1
  Filled 2021-11-27: qty 90, 90d supply, fill #2
  Filled 2022-02-25: qty 90, 90d supply, fill #3

## 2021-04-19 MED ORDER — ROSUVASTATIN CALCIUM 20 MG PO TABS
20.0000 mg | ORAL_TABLET | Freq: Every day | ORAL | 3 refills | Status: AC
  Filled 2021-04-19: qty 90, 90d supply, fill #0

## 2021-04-27 ENCOUNTER — Other Ambulatory Visit (HOSPITAL_COMMUNITY): Payer: Self-pay

## 2021-04-27 MED FILL — Olmesartan Medoxomil Tab 20 MG: ORAL | 30 days supply | Qty: 30 | Fill #2 | Status: AC

## 2021-05-23 DIAGNOSIS — H524 Presbyopia: Secondary | ICD-10-CM | POA: Diagnosis not present

## 2021-05-23 DIAGNOSIS — H52223 Regular astigmatism, bilateral: Secondary | ICD-10-CM | POA: Diagnosis not present

## 2021-05-23 DIAGNOSIS — H40059 Ocular hypertension, unspecified eye: Secondary | ICD-10-CM | POA: Diagnosis not present

## 2021-05-23 DIAGNOSIS — H5213 Myopia, bilateral: Secondary | ICD-10-CM | POA: Diagnosis not present

## 2021-05-28 ENCOUNTER — Other Ambulatory Visit (HOSPITAL_COMMUNITY): Payer: Self-pay

## 2021-05-29 ENCOUNTER — Other Ambulatory Visit (HOSPITAL_COMMUNITY): Payer: Self-pay

## 2021-05-29 MED ORDER — AMLODIPINE BESYLATE 2.5 MG PO TABS
2.5000 mg | ORAL_TABLET | Freq: Every day | ORAL | 2 refills | Status: AC
  Filled 2021-05-29: qty 30, 30d supply, fill #0

## 2021-05-29 MED ORDER — OLMESARTAN MEDOXOMIL 20 MG PO TABS
20.0000 mg | ORAL_TABLET | Freq: Every day | ORAL | 2 refills | Status: AC
  Filled 2021-05-29: qty 90, 90d supply, fill #0

## 2021-08-27 ENCOUNTER — Other Ambulatory Visit (HOSPITAL_COMMUNITY): Payer: Self-pay

## 2021-08-27 MED ORDER — OSELTAMIVIR PHOSPHATE 75 MG PO CAPS
75.0000 mg | ORAL_CAPSULE | Freq: Every day | ORAL | 0 refills | Status: AC
  Filled 2021-08-27: qty 10, 10d supply, fill #0

## 2021-09-12 DIAGNOSIS — I10 Essential (primary) hypertension: Secondary | ICD-10-CM | POA: Diagnosis not present

## 2021-09-12 DIAGNOSIS — E7800 Pure hypercholesterolemia, unspecified: Secondary | ICD-10-CM | POA: Diagnosis not present

## 2021-09-12 DIAGNOSIS — I2584 Coronary atherosclerosis due to calcified coronary lesion: Secondary | ICD-10-CM | POA: Diagnosis not present

## 2021-09-12 DIAGNOSIS — M810 Age-related osteoporosis without current pathological fracture: Secondary | ICD-10-CM | POA: Diagnosis not present

## 2021-10-19 DIAGNOSIS — M810 Age-related osteoporosis without current pathological fracture: Secondary | ICD-10-CM | POA: Diagnosis not present

## 2021-10-19 DIAGNOSIS — I10 Essential (primary) hypertension: Secondary | ICD-10-CM | POA: Diagnosis not present

## 2021-10-23 DIAGNOSIS — Z1211 Encounter for screening for malignant neoplasm of colon: Secondary | ICD-10-CM | POA: Diagnosis not present

## 2021-10-23 DIAGNOSIS — I2510 Atherosclerotic heart disease of native coronary artery without angina pectoris: Secondary | ICD-10-CM | POA: Diagnosis not present

## 2021-10-23 DIAGNOSIS — I10 Essential (primary) hypertension: Secondary | ICD-10-CM | POA: Diagnosis not present

## 2021-10-23 DIAGNOSIS — Z0000 Encounter for general adult medical examination without abnormal findings: Secondary | ICD-10-CM | POA: Diagnosis not present

## 2021-10-23 DIAGNOSIS — M810 Age-related osteoporosis without current pathological fracture: Secondary | ICD-10-CM | POA: Diagnosis not present

## 2021-11-26 ENCOUNTER — Other Ambulatory Visit (HOSPITAL_COMMUNITY): Payer: Self-pay

## 2021-11-26 DIAGNOSIS — R194 Change in bowel habit: Secondary | ICD-10-CM | POA: Diagnosis not present

## 2021-11-26 DIAGNOSIS — Z1211 Encounter for screening for malignant neoplasm of colon: Secondary | ICD-10-CM | POA: Diagnosis not present

## 2021-11-26 DIAGNOSIS — I10 Essential (primary) hypertension: Secondary | ICD-10-CM | POA: Diagnosis not present

## 2021-11-26 DIAGNOSIS — E782 Mixed hyperlipidemia: Secondary | ICD-10-CM | POA: Diagnosis not present

## 2021-11-26 MED ORDER — CLENPIQ 10-3.5-12 MG-GM -GM/160ML PO SOLN
160.0000 mL | ORAL | 0 refills | Status: AC
  Filled 2021-11-26: qty 320, 1d supply, fill #0

## 2021-11-27 ENCOUNTER — Other Ambulatory Visit (HOSPITAL_COMMUNITY): Payer: Self-pay

## 2021-12-06 DIAGNOSIS — Z1211 Encounter for screening for malignant neoplasm of colon: Secondary | ICD-10-CM | POA: Diagnosis not present

## 2021-12-06 DIAGNOSIS — K635 Polyp of colon: Secondary | ICD-10-CM | POA: Diagnosis not present

## 2021-12-06 DIAGNOSIS — K5730 Diverticulosis of large intestine without perforation or abscess without bleeding: Secondary | ICD-10-CM | POA: Diagnosis not present

## 2021-12-06 DIAGNOSIS — D120 Benign neoplasm of cecum: Secondary | ICD-10-CM | POA: Diagnosis not present

## 2021-12-13 ENCOUNTER — Other Ambulatory Visit (HOSPITAL_COMMUNITY): Payer: Self-pay

## 2021-12-13 MED ORDER — FLUTICASONE PROPIONATE 50 MCG/ACT NA SUSP
1.0000 | Freq: Every day | NASAL | 1 refills | Status: DC
  Filled 2021-12-13: qty 16, 60d supply, fill #0

## 2021-12-19 DIAGNOSIS — R21 Rash and other nonspecific skin eruption: Secondary | ICD-10-CM | POA: Diagnosis not present

## 2021-12-19 DIAGNOSIS — L299 Pruritus, unspecified: Secondary | ICD-10-CM | POA: Diagnosis not present

## 2022-02-25 ENCOUNTER — Other Ambulatory Visit (HOSPITAL_COMMUNITY): Payer: Self-pay

## 2022-02-26 ENCOUNTER — Other Ambulatory Visit (HOSPITAL_COMMUNITY): Payer: Self-pay

## 2022-02-26 MED ORDER — ROSUVASTATIN CALCIUM 20 MG PO TABS
20.0000 mg | ORAL_TABLET | Freq: Every day | ORAL | 3 refills | Status: DC
  Filled 2022-02-26: qty 90, 90d supply, fill #0
  Filled 2022-05-24: qty 90, 90d supply, fill #1
  Filled 2022-08-22: qty 90, 90d supply, fill #2
  Filled 2022-11-21: qty 90, 90d supply, fill #3

## 2022-04-01 DIAGNOSIS — Z1231 Encounter for screening mammogram for malignant neoplasm of breast: Secondary | ICD-10-CM | POA: Diagnosis not present

## 2022-04-24 DIAGNOSIS — H40013 Open angle with borderline findings, low risk, bilateral: Secondary | ICD-10-CM | POA: Diagnosis not present

## 2022-04-24 DIAGNOSIS — H25041 Posterior subcapsular polar age-related cataract, right eye: Secondary | ICD-10-CM | POA: Diagnosis not present

## 2022-04-24 DIAGNOSIS — H5213 Myopia, bilateral: Secondary | ICD-10-CM | POA: Diagnosis not present

## 2022-04-24 DIAGNOSIS — H40052 Ocular hypertension, left eye: Secondary | ICD-10-CM | POA: Diagnosis not present

## 2022-05-21 DIAGNOSIS — H2511 Age-related nuclear cataract, right eye: Secondary | ICD-10-CM | POA: Diagnosis not present

## 2022-05-21 DIAGNOSIS — H25041 Posterior subcapsular polar age-related cataract, right eye: Secondary | ICD-10-CM | POA: Diagnosis not present

## 2022-05-21 DIAGNOSIS — H269 Unspecified cataract: Secondary | ICD-10-CM | POA: Diagnosis not present

## 2022-05-24 ENCOUNTER — Other Ambulatory Visit (HOSPITAL_COMMUNITY): Payer: Self-pay

## 2022-05-27 ENCOUNTER — Other Ambulatory Visit (HOSPITAL_COMMUNITY): Payer: Self-pay

## 2022-05-27 MED ORDER — OLMESARTAN MEDOXOMIL 20 MG PO TABS
20.0000 mg | ORAL_TABLET | Freq: Every day | ORAL | 3 refills | Status: DC
  Filled 2022-05-27: qty 90, 90d supply, fill #0
  Filled 2022-08-22: qty 90, 90d supply, fill #1
  Filled 2022-11-21: qty 90, 90d supply, fill #2
  Filled 2023-02-19: qty 90, 90d supply, fill #3

## 2022-05-27 MED ORDER — AMLODIPINE BESYLATE 2.5 MG PO TABS
2.5000 mg | ORAL_TABLET | Freq: Every day | ORAL | 3 refills | Status: DC
  Filled 2022-05-27: qty 90, 90d supply, fill #0
  Filled 2022-08-22: qty 90, 90d supply, fill #1
  Filled 2022-11-21: qty 90, 90d supply, fill #2
  Filled 2023-02-19: qty 90, 90d supply, fill #3

## 2022-06-04 DIAGNOSIS — H2512 Age-related nuclear cataract, left eye: Secondary | ICD-10-CM | POA: Diagnosis not present

## 2022-06-04 DIAGNOSIS — H269 Unspecified cataract: Secondary | ICD-10-CM | POA: Diagnosis not present

## 2022-08-22 ENCOUNTER — Other Ambulatory Visit (HOSPITAL_COMMUNITY): Payer: Self-pay

## 2022-08-28 ENCOUNTER — Other Ambulatory Visit: Payer: Self-pay | Admitting: Family Medicine

## 2022-08-28 ENCOUNTER — Ambulatory Visit: Payer: Self-pay

## 2022-08-28 DIAGNOSIS — M79642 Pain in left hand: Secondary | ICD-10-CM

## 2022-08-28 DIAGNOSIS — M25532 Pain in left wrist: Secondary | ICD-10-CM

## 2022-09-18 ENCOUNTER — Ambulatory Visit (INDEPENDENT_AMBULATORY_CARE_PROVIDER_SITE_OTHER): Payer: PPO | Admitting: Orthopaedic Surgery

## 2022-09-18 DIAGNOSIS — M79645 Pain in left finger(s): Secondary | ICD-10-CM

## 2022-09-18 MED ORDER — DICLOFENAC SODIUM 75 MG PO TBEC: 75.0000 mg | DELAYED_RELEASE_TABLET | Freq: Two times a day (BID) | ORAL | 2 refills | Status: AC

## 2022-09-18 NOTE — Progress Notes (Signed)
Office Visit Note   Patient: Rebecca Davis           Date of Birth: January 25, 1956           MRN: [id number] Visit Date: 09/18/2022              Requested by: Deland Pretty, MD [address] [place],  [place] [address] PCP: Deland Pretty, MD   Assessment & Plan: Visit Diagnoses:  1. Pain of left thumb     Plan: Impression is left hand pain.  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  The x-rays do show large osteophytes and bone-on-bone joint space narrowing.  I do not see any other abnormalities on x-rays.  Her symptoms are consistent with aggravation of CMC arthritis.  She has improved quite a bit since the initial injury.  I would like to keep her on a 5 pound lifting limit and send her to hand therapy for thermoplastic hand-based splint to be worn during use of the hand for the next 4 weeks.  Work note was provided.  I have also recommended taking oral and using topical diclofenac.  We will have her follow-up with me for recheck in 4 weeks.  Follow-Up Instructions: Return in about 4 weeks (around 10/16/2022).   Orders:  Orders Placed This Encounter  Procedures   Ambulatory referral to Occupational Therapy   Ambulatory referral to Occupational Therapy   Meds ordered this encounter  Medications   diclofenac (VOLTAREN) 75 MG EC tablet    Sig: Take 1 tablet (75 mg total) by mouth 2 (two) times daily.    Dispense:  30 tablet    Refill:  2      Procedures: No procedures performed   Clinical Data: No additional findings.   Subjective: Chief Complaint  Patient presents with   Left Wrist - Injury    HPI Rebecca Davis is a 66 year old female comes in for evaluation of a workers comp injury on 08/28/2022.  She works in [employer] at [employer].  She tripped over a landscape border and landed on outstretched hand.  Initially she had significant pain and swelling and bruising and very limited use of the left hand.  X-rays were negative.   Since then she feels much better than before.  She still has some residual pain in the thenar area which is aggravated by opening a doorknob or grasping and making a fist.  She has been wearing a thumb spica Velcro splint during activity.  She is doing well overall.  Review of Systems  Constitutional: Negative.   HENT: Negative.    Eyes: Negative.   Respiratory: Negative.    Cardiovascular: Negative.   Endocrine: Negative.   Musculoskeletal: Negative.   Neurological: Negative.   Hematological: Negative.   Psychiatric/Behavioral: Negative.    All other systems reviewed and are negative.    Objective: Vital Signs: There were no vitals taken for this visit.  Physical Exam Vitals and nursing note reviewed.  Constitutional:      Appearance: She is well-developed.  Pulmonary:     Effort: Pulmonary effort is normal.  Skin:    General: Skin is warm.     Capillary Refill: Capillary refill takes less than 2 seconds.  Neurological:     Mental Status: She is alert and oriented to person, place, and time.  Psychiatric:        Behavior: Behavior normal.        Thought Content: Thought content  normal.        Judgment: Judgment normal.     Ortho Exam Examination of the left hand shows pain and mild crepitus with thumb CMC grind test.  She lacks about a centimeter half of opposition from the thumb tip to the fifth metacarpal head.  There is some residual swelling to the thumb.  Collateral ligaments are stable.  She has no tenderness to the anatomic snuffbox or scaphoid tubercle. Specialty Comments:  No specialty comments available.  Imaging: No results found.   PMFS History: Patient Active Problem List   Diagnosis Date Noted   Elevated coronary artery calcium score 02/17/2021   Essential hypertension 02/17/2021   Widened pulse pressure 02/17/2021   Hypercholesterolemia 02/17/2021   OSTEOPENIA 07/14/2008   Past Medical History:  Diagnosis Date   PONV (postoperative nausea and  vomiting)     Family History  Problem Relation Age of Onset   Anesthesia problems Mother     Past Surgical History:  Procedure Laterality Date   CARPAL TUNNEL RELEASE  2008   right   ORIF TIBIA PLATEAU  09/23/2011   Procedure: OPEN REDUCTION INTERNAL FIXATION (ORIF) TIBIAL PLATEAU;  Surgeon: Marybelle Killings;  Location: [place];  Service: Orthopedics;  Laterality: Left;   VAGINAL HYSTERECTOMY     partial   Social History   Occupational History   Not on file  Tobacco Use   Smoking status: Never   Smokeless tobacco: Never  Substance and Sexual Activity   Alcohol use: Not on file    Comment: rarely   Drug use: No   Sexual activity: Not on file    Comment: hysterectomy-partial

## 2022-10-02 DIAGNOSIS — R051 Acute cough: Secondary | ICD-10-CM | POA: Diagnosis not present

## 2022-10-31 DIAGNOSIS — E7800 Pure hypercholesterolemia, unspecified: Secondary | ICD-10-CM | POA: Diagnosis not present

## 2022-10-31 DIAGNOSIS — Z0000 Encounter for general adult medical examination without abnormal findings: Secondary | ICD-10-CM | POA: Diagnosis not present

## 2022-10-31 DIAGNOSIS — M810 Age-related osteoporosis without current pathological fracture: Secondary | ICD-10-CM | POA: Diagnosis not present

## 2022-11-11 DIAGNOSIS — Z23 Encounter for immunization: Secondary | ICD-10-CM | POA: Diagnosis not present

## 2022-11-11 DIAGNOSIS — Z8639 Personal history of other endocrine, nutritional and metabolic disease: Secondary | ICD-10-CM | POA: Diagnosis not present

## 2022-11-11 DIAGNOSIS — M810 Age-related osteoporosis without current pathological fracture: Secondary | ICD-10-CM | POA: Diagnosis not present

## 2022-11-11 DIAGNOSIS — I2510 Atherosclerotic heart disease of native coronary artery without angina pectoris: Secondary | ICD-10-CM | POA: Diagnosis not present

## 2022-11-11 DIAGNOSIS — Z2821 Immunization not carried out because of patient refusal: Secondary | ICD-10-CM | POA: Diagnosis not present

## 2022-11-11 DIAGNOSIS — Z0000 Encounter for general adult medical examination without abnormal findings: Secondary | ICD-10-CM | POA: Diagnosis not present

## 2022-11-11 DIAGNOSIS — I10 Essential (primary) hypertension: Secondary | ICD-10-CM | POA: Diagnosis not present

## 2022-11-18 DIAGNOSIS — M810 Age-related osteoporosis without current pathological fracture: Secondary | ICD-10-CM | POA: Diagnosis not present

## 2022-11-25 ENCOUNTER — Other Ambulatory Visit (HOSPITAL_COMMUNITY): Payer: Self-pay

## 2023-02-19 ENCOUNTER — Other Ambulatory Visit (HOSPITAL_COMMUNITY): Payer: Self-pay

## 2023-02-19 MED ORDER — ROSUVASTATIN CALCIUM 20 MG PO TABS
20.0000 mg | ORAL_TABLET | Freq: Every day | ORAL | 3 refills | Status: DC
  Filled 2023-02-19: qty 90, 90d supply, fill #0
  Filled 2023-05-19: qty 90, 90d supply, fill #1
  Filled 2023-08-18: qty 90, 90d supply, fill #2
  Filled 2023-12-14: qty 90, 90d supply, fill #3

## 2023-04-02 DIAGNOSIS — H04123 Dry eye syndrome of bilateral lacrimal glands: Secondary | ICD-10-CM | POA: Diagnosis not present

## 2023-04-02 DIAGNOSIS — Z961 Presence of intraocular lens: Secondary | ICD-10-CM | POA: Diagnosis not present

## 2023-04-07 DIAGNOSIS — Z1231 Encounter for screening mammogram for malignant neoplasm of breast: Secondary | ICD-10-CM | POA: Diagnosis not present

## 2023-05-19 ENCOUNTER — Other Ambulatory Visit (HOSPITAL_COMMUNITY): Payer: Self-pay

## 2023-05-20 ENCOUNTER — Other Ambulatory Visit (HOSPITAL_COMMUNITY): Payer: Self-pay

## 2023-05-20 MED ORDER — AMLODIPINE BESYLATE 2.5 MG PO TABS
2.5000 mg | ORAL_TABLET | Freq: Every day | ORAL | 3 refills | Status: DC
  Filled 2023-05-20: qty 90, 90d supply, fill #0
  Filled 2023-08-18: qty 90, 90d supply, fill #1
  Filled 2023-12-14: qty 90, 90d supply, fill #2
  Filled 2024-03-17: qty 90, 90d supply, fill #3

## 2023-05-20 MED ORDER — OLMESARTAN MEDOXOMIL 20 MG PO TABS
20.0000 mg | ORAL_TABLET | Freq: Every day | ORAL | 3 refills | Status: DC
  Filled 2023-05-20: qty 90, 90d supply, fill #0
  Filled 2023-08-18: qty 90, 90d supply, fill #1
  Filled 2023-11-18: qty 90, 90d supply, fill #2
  Filled 2024-02-16: qty 90, 90d supply, fill #3

## 2023-05-22 ENCOUNTER — Other Ambulatory Visit (HOSPITAL_COMMUNITY): Payer: Self-pay

## 2023-05-26 DIAGNOSIS — M810 Age-related osteoporosis without current pathological fracture: Secondary | ICD-10-CM | POA: Diagnosis not present

## 2023-07-02 DIAGNOSIS — M7061 Trochanteric bursitis, right hip: Secondary | ICD-10-CM | POA: Diagnosis not present

## 2023-07-02 DIAGNOSIS — M1611 Unilateral primary osteoarthritis, right hip: Secondary | ICD-10-CM | POA: Diagnosis not present

## 2023-08-16 DIAGNOSIS — S83422A Sprain of lateral collateral ligament of left knee, initial encounter: Secondary | ICD-10-CM | POA: Diagnosis not present

## 2023-08-16 DIAGNOSIS — M79605 Pain in left leg: Secondary | ICD-10-CM | POA: Diagnosis not present

## 2023-11-11 DIAGNOSIS — I10 Essential (primary) hypertension: Secondary | ICD-10-CM | POA: Diagnosis not present

## 2023-11-11 DIAGNOSIS — R7301 Impaired fasting glucose: Secondary | ICD-10-CM | POA: Diagnosis not present

## 2023-11-11 DIAGNOSIS — M810 Age-related osteoporosis without current pathological fracture: Secondary | ICD-10-CM | POA: Diagnosis not present

## 2023-11-18 ENCOUNTER — Other Ambulatory Visit (HOSPITAL_COMMUNITY): Payer: Self-pay

## 2023-11-28 DIAGNOSIS — M810 Age-related osteoporosis without current pathological fracture: Secondary | ICD-10-CM | POA: Diagnosis not present

## 2023-12-05 DIAGNOSIS — Z0000 Encounter for general adult medical examination without abnormal findings: Secondary | ICD-10-CM | POA: Diagnosis not present

## 2023-12-05 DIAGNOSIS — M2550 Pain in unspecified joint: Secondary | ICD-10-CM | POA: Diagnosis not present

## 2023-12-05 DIAGNOSIS — I10 Essential (primary) hypertension: Secondary | ICD-10-CM | POA: Diagnosis not present

## 2023-12-05 DIAGNOSIS — I2510 Atherosclerotic heart disease of native coronary artery without angina pectoris: Secondary | ICD-10-CM | POA: Diagnosis not present

## 2023-12-05 DIAGNOSIS — M810 Age-related osteoporosis without current pathological fracture: Secondary | ICD-10-CM | POA: Diagnosis not present

## 2023-12-05 DIAGNOSIS — M65311 Trigger thumb, right thumb: Secondary | ICD-10-CM | POA: Diagnosis not present

## 2023-12-05 DIAGNOSIS — R42 Dizziness and giddiness: Secondary | ICD-10-CM | POA: Diagnosis not present

## 2023-12-08 DIAGNOSIS — M7061 Trochanteric bursitis, right hip: Secondary | ICD-10-CM | POA: Diagnosis not present

## 2023-12-08 DIAGNOSIS — M542 Cervicalgia: Secondary | ICD-10-CM | POA: Diagnosis not present

## 2023-12-08 DIAGNOSIS — M1611 Unilateral primary osteoarthritis, right hip: Secondary | ICD-10-CM | POA: Diagnosis not present

## 2023-12-15 DIAGNOSIS — R42 Dizziness and giddiness: Secondary | ICD-10-CM | POA: Diagnosis not present

## 2024-02-02 DIAGNOSIS — M79641 Pain in right hand: Secondary | ICD-10-CM | POA: Diagnosis not present

## 2024-02-02 DIAGNOSIS — M79642 Pain in left hand: Secondary | ICD-10-CM | POA: Diagnosis not present

## 2024-02-02 DIAGNOSIS — M65311 Trigger thumb, right thumb: Secondary | ICD-10-CM | POA: Diagnosis not present

## 2024-02-02 DIAGNOSIS — M19041 Primary osteoarthritis, right hand: Secondary | ICD-10-CM | POA: Diagnosis not present

## 2024-03-01 DIAGNOSIS — M13841 Other specified arthritis, right hand: Secondary | ICD-10-CM | POA: Diagnosis not present

## 2024-03-01 DIAGNOSIS — R42 Dizziness and giddiness: Secondary | ICD-10-CM | POA: Diagnosis not present

## 2024-03-01 DIAGNOSIS — M65311 Trigger thumb, right thumb: Secondary | ICD-10-CM | POA: Diagnosis not present

## 2024-03-02 ENCOUNTER — Other Ambulatory Visit: Payer: Self-pay

## 2024-03-05 DIAGNOSIS — M810 Age-related osteoporosis without current pathological fracture: Secondary | ICD-10-CM | POA: Diagnosis not present

## 2024-03-17 ENCOUNTER — Other Ambulatory Visit (HOSPITAL_COMMUNITY): Payer: Self-pay

## 2024-03-18 ENCOUNTER — Other Ambulatory Visit (HOSPITAL_COMMUNITY): Payer: Self-pay

## 2024-03-18 MED ORDER — ROSUVASTATIN CALCIUM 20 MG PO TABS
20.0000 mg | ORAL_TABLET | Freq: Every day | ORAL | 3 refills | Status: AC
  Filled 2024-03-18 – 2024-06-15 (×2): qty 90, 90d supply, fill #0
  Filled 2024-06-15 – 2024-09-19 (×2): qty 90, 90d supply, fill #1
  Filled 2024-09-21: qty 90, 90d supply, fill #0

## 2024-04-12 DIAGNOSIS — Z1231 Encounter for screening mammogram for malignant neoplasm of breast: Secondary | ICD-10-CM | POA: Diagnosis not present

## 2024-05-17 ENCOUNTER — Other Ambulatory Visit (HOSPITAL_COMMUNITY): Payer: Self-pay

## 2024-05-17 MED ORDER — OLMESARTAN MEDOXOMIL 20 MG PO TABS
20.0000 mg | ORAL_TABLET | Freq: Every day | ORAL | 3 refills | Status: AC
  Filled 2024-05-17: qty 90, 90d supply, fill #0
  Filled 2024-08-14: qty 90, 90d supply, fill #1
  Filled 2024-11-10: qty 90, 90d supply, fill #2

## 2024-05-28 DIAGNOSIS — M810 Age-related osteoporosis without current pathological fracture: Secondary | ICD-10-CM | POA: Diagnosis not present

## 2024-06-01 DIAGNOSIS — L509 Urticaria, unspecified: Secondary | ICD-10-CM | POA: Diagnosis not present

## 2024-06-01 DIAGNOSIS — T783XXA Angioneurotic edema, initial encounter: Secondary | ICD-10-CM | POA: Diagnosis not present

## 2024-06-11 DIAGNOSIS — H04123 Dry eye syndrome of bilateral lacrimal glands: Secondary | ICD-10-CM | POA: Diagnosis not present

## 2024-06-11 DIAGNOSIS — Z961 Presence of intraocular lens: Secondary | ICD-10-CM | POA: Diagnosis not present

## 2024-06-11 DIAGNOSIS — H0015 Chalazion left lower eyelid: Secondary | ICD-10-CM | POA: Diagnosis not present

## 2024-06-14 DIAGNOSIS — Z4789 Encounter for other orthopedic aftercare: Secondary | ICD-10-CM | POA: Diagnosis not present

## 2024-06-14 DIAGNOSIS — M65942 Unspecified synovitis and tenosynovitis, left hand: Secondary | ICD-10-CM | POA: Diagnosis not present

## 2024-06-14 DIAGNOSIS — M65311 Trigger thumb, right thumb: Secondary | ICD-10-CM | POA: Diagnosis not present

## 2024-06-14 DIAGNOSIS — M19042 Primary osteoarthritis, left hand: Secondary | ICD-10-CM | POA: Diagnosis not present

## 2024-06-15 ENCOUNTER — Other Ambulatory Visit (HOSPITAL_COMMUNITY): Payer: Self-pay

## 2024-06-15 ENCOUNTER — Other Ambulatory Visit: Payer: Self-pay

## 2024-06-15 ENCOUNTER — Other Ambulatory Visit (HOSPITAL_BASED_OUTPATIENT_CLINIC_OR_DEPARTMENT_OTHER): Payer: Self-pay

## 2024-06-15 MED ORDER — AMLODIPINE BESYLATE 2.5 MG PO TABS
2.5000 mg | ORAL_TABLET | Freq: Every day | ORAL | 3 refills | Status: AC
  Filled 2024-06-15 (×2): qty 90, 90d supply, fill #0
  Filled 2024-09-19: qty 90, 90d supply, fill #1
  Filled 2024-09-21: qty 90, 90d supply, fill #0

## 2024-06-29 DIAGNOSIS — M25641 Stiffness of right hand, not elsewhere classified: Secondary | ICD-10-CM | POA: Diagnosis not present

## 2024-08-18 ENCOUNTER — Other Ambulatory Visit (HOSPITAL_COMMUNITY): Payer: Self-pay

## 2024-09-21 ENCOUNTER — Other Ambulatory Visit (HOSPITAL_COMMUNITY): Payer: Self-pay

## 2024-09-22 ENCOUNTER — Other Ambulatory Visit (HOSPITAL_COMMUNITY): Payer: Self-pay

## 2024-11-11 ENCOUNTER — Other Ambulatory Visit (HOSPITAL_COMMUNITY): Payer: Self-pay
